# Patient Record
Sex: Female | Born: 1985 | Hispanic: No | Marital: Married | Smoking: Never smoker
Health system: Southern US, Community
[De-identification: ages and names within clinical notes are randomized; demographics above are authoritative.]

## PROBLEM LIST (undated history)

## (undated) DIAGNOSIS — K219 Gastro-esophageal reflux disease without esophagitis: Secondary | ICD-10-CM

## (undated) DIAGNOSIS — M419 Scoliosis, unspecified: Secondary | ICD-10-CM

## (undated) HISTORY — PX: NO PAST SURGERIES: SHX2092

---

## 2014-02-11 HISTORY — PX: WISDOM TOOTH EXTRACTION: SHX21

## 2015-07-24 LAB — OB RESULTS CONSOLE GC/CHLAMYDIA
Chlamydia: NEGATIVE
GC PROBE AMP, GENITAL: NEGATIVE

## 2015-07-24 LAB — OB RESULTS CONSOLE RUBELLA ANTIBODY, IGM: RUBELLA: IMMUNE

## 2015-07-24 LAB — OB RESULTS CONSOLE ANTIBODY SCREEN: Antibody Screen: NEGATIVE

## 2015-07-24 LAB — OB RESULTS CONSOLE ABO/RH: RH TYPE: POSITIVE

## 2015-07-24 LAB — OB RESULTS CONSOLE HIV ANTIBODY (ROUTINE TESTING): HIV: NONREACTIVE

## 2015-07-24 LAB — OB RESULTS CONSOLE RPR: RPR: NONREACTIVE

## 2015-07-24 LAB — OB RESULTS CONSOLE HEPATITIS B SURFACE ANTIGEN: Hepatitis B Surface Ag: NEGATIVE

## 2016-01-19 ENCOUNTER — Other Ambulatory Visit (HOSPITAL_COMMUNITY): Payer: Self-pay | Admitting: Obstetrics & Gynecology

## 2016-01-19 DIAGNOSIS — O283 Abnormal ultrasonic finding on antenatal screening of mother: Secondary | ICD-10-CM

## 2016-01-19 DIAGNOSIS — Z3689 Encounter for other specified antenatal screening: Secondary | ICD-10-CM

## 2016-01-19 DIAGNOSIS — Z3A34 34 weeks gestation of pregnancy: Secondary | ICD-10-CM

## 2016-01-25 ENCOUNTER — Encounter (HOSPITAL_COMMUNITY): Payer: Self-pay | Admitting: *Deleted

## 2016-01-26 ENCOUNTER — Ambulatory Visit (HOSPITAL_COMMUNITY)
Admission: RE | Admit: 2016-01-26 | Discharge: 2016-01-26 | Disposition: A | Discharge status: Home / Self Care | Payer: 59 | Source: Ambulatory Visit | Attending: Obstetrics & Gynecology | Admitting: Obstetrics & Gynecology

## 2016-01-26 ENCOUNTER — Encounter (HOSPITAL_COMMUNITY): Payer: Self-pay

## 2016-01-26 DIAGNOSIS — Z3A34 34 weeks gestation of pregnancy: Secondary | ICD-10-CM | POA: Insufficient documentation

## 2016-01-26 DIAGNOSIS — Z363 Encounter for antenatal screening for malformations: Secondary | ICD-10-CM | POA: Insufficient documentation

## 2016-01-26 DIAGNOSIS — O289 Unspecified abnormal findings on antenatal screening of mother: Secondary | ICD-10-CM | POA: Insufficient documentation

## 2016-01-26 DIAGNOSIS — O283 Abnormal ultrasonic finding on antenatal screening of mother: Secondary | ICD-10-CM

## 2016-01-26 DIAGNOSIS — Z3689 Encounter for other specified antenatal screening: Secondary | ICD-10-CM

## 2016-01-26 HISTORY — DX: Scoliosis, unspecified: M41.9

## 2016-02-02 LAB — OB RESULTS CONSOLE GBS: STREP GROUP B AG: NEGATIVE

## 2016-03-04 ENCOUNTER — Telehealth (HOSPITAL_COMMUNITY): Payer: Self-pay | Admitting: *Deleted

## 2016-03-04 ENCOUNTER — Encounter (HOSPITAL_COMMUNITY): Payer: Self-pay | Admitting: *Deleted

## 2016-03-04 NOTE — Telephone Encounter (Signed)
Preadmission screen  

## 2016-03-06 ENCOUNTER — Inpatient Hospital Stay (HOSPITAL_COMMUNITY)
Admission: AD | Admit: 2016-03-06 | Discharge: 2016-03-09 | DRG: 766 | Disposition: A | Discharge status: Home / Self Care | Payer: 59 | Source: Ambulatory Visit | Attending: Obstetrics & Gynecology | Admitting: Obstetrics & Gynecology

## 2016-03-06 ENCOUNTER — Inpatient Hospital Stay (HOSPITAL_COMMUNITY): Payer: 59 | Admitting: Anesthesiology

## 2016-03-06 ENCOUNTER — Encounter (HOSPITAL_COMMUNITY): Payer: Self-pay

## 2016-03-06 ENCOUNTER — Encounter (HOSPITAL_COMMUNITY)
Admission: AD | Disposition: A | Discharge status: Home / Self Care | Payer: Self-pay | Source: Ambulatory Visit | Attending: Obstetrics & Gynecology

## 2016-03-06 DIAGNOSIS — Z98891 History of uterine scar from previous surgery: Secondary | ICD-10-CM

## 2016-03-06 DIAGNOSIS — Z3A40 40 weeks gestation of pregnancy: Secondary | ICD-10-CM | POA: Diagnosis not present

## 2016-03-06 DIAGNOSIS — O324XX0 Maternal care for high head at term, not applicable or unspecified: Principal | ICD-10-CM | POA: Diagnosis present

## 2016-03-06 DIAGNOSIS — M419 Scoliosis, unspecified: Secondary | ICD-10-CM | POA: Diagnosis present

## 2016-03-06 DIAGNOSIS — Z3493 Encounter for supervision of normal pregnancy, unspecified, third trimester: Secondary | ICD-10-CM | POA: Diagnosis present

## 2016-03-06 DIAGNOSIS — Z8739 Personal history of other diseases of the musculoskeletal system and connective tissue: Secondary | ICD-10-CM

## 2016-03-06 LAB — CBC
HEMATOCRIT: 39.2 % (ref 36.0–46.0)
Hemoglobin: 13.3 g/dL (ref 12.0–15.0)
MCH: 29.9 pg (ref 26.0–34.0)
MCHC: 33.9 g/dL (ref 30.0–36.0)
MCV: 88.1 fL (ref 78.0–100.0)
Platelets: 198 10*3/uL (ref 150–400)
RBC: 4.45 MIL/uL (ref 3.87–5.11)
RDW: 14.4 % (ref 11.5–15.5)
WBC: 9.4 10*3/uL (ref 4.0–10.5)

## 2016-03-06 LAB — TYPE AND SCREEN
ABO/RH(D): AB POS
ANTIBODY SCREEN: NEGATIVE

## 2016-03-06 LAB — ABO/RH: ABO/RH(D): AB POS

## 2016-03-06 LAB — RPR: RPR: NONREACTIVE

## 2016-03-06 SURGERY — Surgical Case
Anesthesia: Epidural

## 2016-03-06 MED ORDER — LACTATED RINGERS IV SOLN
500.0000 mL | Freq: Once | INTRAVENOUS | Status: AC
  Administered 2016-03-06: 500 mL via INTRAVENOUS

## 2016-03-06 MED ORDER — SCOPOLAMINE 1 MG/3DAYS TD PT72
MEDICATED_PATCH | TRANSDERMAL | Status: DC | PRN
  Administered 2016-03-06: 1 via TRANSDERMAL

## 2016-03-06 MED ORDER — LACTATED RINGERS IV SOLN
500.0000 mL | INTRAVENOUS | Status: DC | PRN
  Administered 2016-03-06: 50 mL via INTRAVENOUS

## 2016-03-06 MED ORDER — CEFAZOLIN SODIUM-DEXTROSE 2-4 GM/100ML-% IV SOLN: 2.0000 g | Freq: Once | INTRAVENOUS | Status: DC

## 2016-03-06 MED ORDER — DIPHENHYDRAMINE HCL 25 MG PO CAPS: 25.0000 mg | ORAL_CAPSULE | Freq: Four times a day (QID) | ORAL | Status: DC | PRN

## 2016-03-06 MED ORDER — IBUPROFEN 600 MG PO TABS
600.0000 mg | ORAL_TABLET | Freq: Four times a day (QID) | ORAL | Status: DC
  Administered 2016-03-06 – 2016-03-09 (×11): 600 mg via ORAL
  Filled 2016-03-06 (×11): qty 1

## 2016-03-06 MED ORDER — LIDOCAINE HCL (PF) 1 % IJ SOLN
INTRAMUSCULAR | Status: DC | PRN
  Administered 2016-03-06: 4 mL via EPIDURAL

## 2016-03-06 MED ORDER — EPHEDRINE 5 MG/ML INJ: 10.0000 mg | INTRAVENOUS | Status: DC | PRN

## 2016-03-06 MED ORDER — SODIUM BICARBONATE 8.4 % IV SOLN
INTRAVENOUS | Status: DC | PRN
  Administered 2016-03-06 (×2): 5 mL via EPIDURAL

## 2016-03-06 MED ORDER — CEFAZOLIN SODIUM-DEXTROSE 2-4 GM/100ML-% IV SOLN
INTRAVENOUS | Status: AC
  Filled 2016-03-06: qty 100

## 2016-03-06 MED ORDER — SENNOSIDES-DOCUSATE SODIUM 8.6-50 MG PO TABS
2.0000 | ORAL_TABLET | ORAL | Status: DC
  Administered 2016-03-06 – 2016-03-08 (×3): 2 via ORAL
  Filled 2016-03-06 (×3): qty 2

## 2016-03-06 MED ORDER — PANTOPRAZOLE SODIUM 20 MG PO TBEC
20.0000 mg | DELAYED_RELEASE_TABLET | Freq: Every day | ORAL | Status: DC
  Administered 2016-03-06: 20 mg via ORAL
  Filled 2016-03-06 (×2): qty 1

## 2016-03-06 MED ORDER — NALBUPHINE HCL 10 MG/ML IJ SOLN: 5.0000 mg | INTRAMUSCULAR | Status: DC | PRN

## 2016-03-06 MED ORDER — METOCLOPRAMIDE HCL 5 MG/ML IJ SOLN
INTRAMUSCULAR | Status: DC | PRN
  Administered 2016-03-06: 10 mg via INTRAVENOUS

## 2016-03-06 MED ORDER — PHENYLEPHRINE HCL 10 MG/ML IJ SOLN
INTRAMUSCULAR | Status: DC | PRN
  Administered 2016-03-06 (×2): 40 ug via INTRAVENOUS

## 2016-03-06 MED ORDER — MEPERIDINE HCL 25 MG/ML IJ SOLN: 6.2500 mg | INTRAMUSCULAR | Status: DC | PRN

## 2016-03-06 MED ORDER — MORPHINE SULFATE (PF) 0.5 MG/ML IJ SOLN
INTRAMUSCULAR | Status: DC | PRN
  Administered 2016-03-06: 3 mg via EPIDURAL

## 2016-03-06 MED ORDER — MENTHOL 3 MG MT LOZG: 1.0000 | LOZENGE | OROMUCOSAL | Status: DC | PRN

## 2016-03-06 MED ORDER — OXYTOCIN 10 UNIT/ML IJ SOLN
INTRAMUSCULAR | Status: AC
  Filled 2016-03-06: qty 4

## 2016-03-06 MED ORDER — DIPHENHYDRAMINE HCL 50 MG/ML IJ SOLN: 12.5000 mg | INTRAMUSCULAR | Status: DC | PRN

## 2016-03-06 MED ORDER — DIPHENHYDRAMINE HCL 25 MG PO CAPS: 25.0000 mg | ORAL_CAPSULE | ORAL | Status: DC | PRN

## 2016-03-06 MED ORDER — COCONUT OIL OIL
1.0000 "application " | TOPICAL_OIL | Status: DC | PRN
  Administered 2016-03-09: 1 via TOPICAL
  Filled 2016-03-06: qty 120

## 2016-03-06 MED ORDER — SCOPOLAMINE 1 MG/3DAYS TD PT72
1.0000 | MEDICATED_PATCH | Freq: Once | TRANSDERMAL | Status: DC
  Filled 2016-03-06: qty 1

## 2016-03-06 MED ORDER — OXYTOCIN 40 UNITS IN LACTATED RINGERS INFUSION - SIMPLE MED
1.0000 m[IU]/min | INTRAVENOUS | Status: DC
  Administered 2016-03-06 (×2): 2 m[IU]/min via INTRAVENOUS

## 2016-03-06 MED ORDER — NALBUPHINE HCL 10 MG/ML IJ SOLN: 5.0000 mg | Freq: Once | INTRAMUSCULAR | Status: DC | PRN

## 2016-03-06 MED ORDER — FENTANYL 2.5 MCG/ML BUPIVACAINE 1/10 % EPIDURAL INFUSION (WH - ANES)
14.0000 mL/h | INTRAMUSCULAR | Status: DC | PRN
  Administered 2016-03-06 (×2): 14 mL/h via EPIDURAL
  Administered 2016-03-06: 10 mL/h via EPIDURAL
  Filled 2016-03-06: qty 100

## 2016-03-06 MED ORDER — LIDOCAINE-EPINEPHRINE (PF) 2 %-1:200000 IJ SOLN
INTRAMUSCULAR | Status: AC
  Filled 2016-03-06: qty 20

## 2016-03-06 MED ORDER — PHENYLEPHRINE 40 MCG/ML (10ML) SYRINGE FOR IV PUSH (FOR BLOOD PRESSURE SUPPORT): 80.0000 ug | PREFILLED_SYRINGE | INTRAVENOUS | Status: DC | PRN

## 2016-03-06 MED ORDER — MORPHINE SULFATE (PF) 0.5 MG/ML IJ SOLN
INTRAMUSCULAR | Status: AC
  Filled 2016-03-06: qty 10

## 2016-03-06 MED ORDER — LACTATED RINGERS IV SOLN
INTRAVENOUS | Status: DC
Start: 2016-03-06 — End: 2016-03-06
  Administered 2016-03-06: 150 mL/h via INTRAUTERINE

## 2016-03-06 MED ORDER — NALOXONE HCL 0.4 MG/ML IJ SOLN: 0.4000 mg | INTRAMUSCULAR | Status: DC | PRN

## 2016-03-06 MED ORDER — PHENYLEPHRINE 40 MCG/ML (10ML) SYRINGE FOR IV PUSH (FOR BLOOD PRESSURE SUPPORT)
PREFILLED_SYRINGE | INTRAVENOUS | Status: AC
  Filled 2016-03-06: qty 20

## 2016-03-06 MED ORDER — ACETAMINOPHEN 325 MG PO TABS: 650.0000 mg | ORAL_TABLET | ORAL | Status: DC | PRN

## 2016-03-06 MED ORDER — IBUPROFEN 600 MG PO TABS: 600.0000 mg | ORAL_TABLET | Freq: Four times a day (QID) | ORAL | Status: DC

## 2016-03-06 MED ORDER — TERBUTALINE SULFATE 1 MG/ML IJ SOLN: 0.2500 mg | Freq: Once | INTRAMUSCULAR | Status: DC | PRN

## 2016-03-06 MED ORDER — SODIUM CHLORIDE 0.9% FLUSH: 3.0000 mL | INTRAVENOUS | Status: DC | PRN

## 2016-03-06 MED ORDER — LACTATED RINGERS IV SOLN
INTRAVENOUS | Status: DC
  Administered 2016-03-07: 05:00:00 via INTRAVENOUS
  Administered 2016-03-07: 1 mL via INTRAVENOUS

## 2016-03-06 MED ORDER — FLEET ENEMA 7-19 GM/118ML RE ENEM: 1.0000 | ENEMA | RECTAL | Status: DC | PRN

## 2016-03-06 MED ORDER — LACTATED RINGERS IV SOLN
INTRAVENOUS | Status: DC
  Administered 2016-03-06 (×2): via INTRAVENOUS
  Administered 2016-03-06: 125 mL/h via INTRAVENOUS

## 2016-03-06 MED ORDER — CEFAZOLIN SODIUM-DEXTROSE 2-3 GM-% IV SOLR
INTRAVENOUS | Status: DC | PRN
  Administered 2016-03-06: 2 g via INTRAVENOUS

## 2016-03-06 MED ORDER — WITCH HAZEL-GLYCERIN EX PADS: 1.0000 "application " | MEDICATED_PAD | CUTANEOUS | Status: DC | PRN

## 2016-03-06 MED ORDER — OXYTOCIN 40 UNITS IN LACTATED RINGERS INFUSION - SIMPLE MED: 2.5000 [IU]/h | INTRAVENOUS | Status: AC

## 2016-03-06 MED ORDER — SODIUM CHLORIDE 0.9 % IR SOLN
Status: DC | PRN
  Administered 2016-03-06: 1

## 2016-03-06 MED ORDER — FENTANYL 2.5 MCG/ML BUPIVACAINE 1/10 % EPIDURAL INFUSION (WH - ANES)
INTRAMUSCULAR | Status: AC
  Filled 2016-03-06: qty 100

## 2016-03-06 MED ORDER — ONDANSETRON HCL 4 MG/2ML IJ SOLN: 4.0000 mg | Freq: Three times a day (TID) | INTRAMUSCULAR | Status: DC | PRN

## 2016-03-06 MED ORDER — TETANUS-DIPHTH-ACELL PERTUSSIS 5-2.5-18.5 LF-MCG/0.5 IM SUSP: 0.5000 mL | Freq: Once | INTRAMUSCULAR | Status: DC

## 2016-03-06 MED ORDER — ONDANSETRON HCL 4 MG/2ML IJ SOLN
INTRAMUSCULAR | Status: AC
  Filled 2016-03-06: qty 2

## 2016-03-06 MED ORDER — OXYTOCIN 40 UNITS IN LACTATED RINGERS INFUSION - SIMPLE MED
2.5000 [IU]/h | INTRAVENOUS | Status: DC
  Filled 2016-03-06: qty 1000

## 2016-03-06 MED ORDER — LACTATED RINGERS IV SOLN
INTRAVENOUS | Status: DC | PRN
  Administered 2016-03-06 (×3): via INTRAVENOUS

## 2016-03-06 MED ORDER — KETOROLAC TROMETHAMINE 30 MG/ML IJ SOLN: 30.0000 mg | Freq: Four times a day (QID) | INTRAMUSCULAR | Status: AC | PRN

## 2016-03-06 MED ORDER — LACTATED RINGERS IV SOLN: 500.0000 mL | Freq: Once | INTRAVENOUS | Status: DC

## 2016-03-06 MED ORDER — LIDOCAINE HCL (PF) 1 % IJ SOLN: 30.0000 mL | INTRAMUSCULAR | Status: DC | PRN

## 2016-03-06 MED ORDER — DIBUCAINE 1 % RE OINT: 1.0000 "application " | TOPICAL_OINTMENT | RECTAL | Status: DC | PRN

## 2016-03-06 MED ORDER — SIMETHICONE 80 MG PO CHEW
80.0000 mg | CHEWABLE_TABLET | ORAL | Status: DC
  Administered 2016-03-06 – 2016-03-08 (×3): 80 mg via ORAL
  Filled 2016-03-06 (×3): qty 1

## 2016-03-06 MED ORDER — OXYTOCIN BOLUS FROM INFUSION: 500.0000 mL | Freq: Once | INTRAVENOUS | Status: DC

## 2016-03-06 MED ORDER — DEXAMETHASONE SODIUM PHOSPHATE 10 MG/ML IJ SOLN
INTRAMUSCULAR | Status: DC | PRN
  Administered 2016-03-06: 4 mg via INTRAVENOUS

## 2016-03-06 MED ORDER — OXYTOCIN 10 UNIT/ML IJ SOLN
INTRAVENOUS | Status: DC | PRN
  Administered 2016-03-06: 40 [IU] via INTRAVENOUS

## 2016-03-06 MED ORDER — METHYLERGONOVINE MALEATE 0.2 MG/ML IJ SOLN
INTRAMUSCULAR | Status: AC
  Filled 2016-03-06: qty 1

## 2016-03-06 MED ORDER — SIMETHICONE 80 MG PO CHEW
80.0000 mg | CHEWABLE_TABLET | Freq: Three times a day (TID) | ORAL | Status: DC
  Administered 2016-03-07 – 2016-03-09 (×7): 80 mg via ORAL
  Filled 2016-03-06 (×7): qty 1

## 2016-03-06 MED ORDER — LACTATED RINGERS IV SOLN
INTRAVENOUS | Status: DC | PRN
  Administered 2016-03-06: 19:00:00 via INTRAVENOUS

## 2016-03-06 MED ORDER — SOD CITRATE-CITRIC ACID 500-334 MG/5ML PO SOLN
30.0000 mL | ORAL | Status: DC | PRN
  Administered 2016-03-06 (×2): 30 mL via ORAL
  Filled 2016-03-06 (×2): qty 15

## 2016-03-06 MED ORDER — ZOLPIDEM TARTRATE 5 MG PO TABS: 5.0000 mg | ORAL_TABLET | Freq: Every evening | ORAL | Status: DC | PRN

## 2016-03-06 MED ORDER — METHYLERGONOVINE MALEATE 0.2 MG/ML IJ SOLN
INTRAMUSCULAR | Status: DC | PRN
  Administered 2016-03-06: 0.2 mg via INTRAMUSCULAR

## 2016-03-06 MED ORDER — NALOXONE HCL 2 MG/2ML IJ SOSY
1.0000 ug/kg/h | PREFILLED_SYRINGE | INTRAVENOUS | Status: DC | PRN
  Filled 2016-03-06: qty 2

## 2016-03-06 MED ORDER — FENTANYL CITRATE (PF) 100 MCG/2ML IJ SOLN
INTRAMUSCULAR | Status: AC
  Filled 2016-03-06: qty 2

## 2016-03-06 MED ORDER — SODIUM BICARBONATE 8.4 % IV SOLN
INTRAVENOUS | Status: AC
  Filled 2016-03-06: qty 50

## 2016-03-06 MED ORDER — PROMETHAZINE HCL 25 MG/ML IJ SOLN
6.2500 mg | INTRAMUSCULAR | Status: DC | PRN
Start: 2016-03-06 — End: 2016-03-06

## 2016-03-06 MED ORDER — LACTATED RINGERS IV SOLN: INTRAVENOUS | Status: DC

## 2016-03-06 MED ORDER — ONDANSETRON HCL 4 MG/2ML IJ SOLN
4.0000 mg | Freq: Four times a day (QID) | INTRAMUSCULAR | Status: DC | PRN
  Administered 2016-03-06: 4 mg via INTRAVENOUS
  Filled 2016-03-06: qty 2

## 2016-03-06 MED ORDER — SIMETHICONE 80 MG PO CHEW: 80.0000 mg | CHEWABLE_TABLET | ORAL | Status: DC | PRN

## 2016-03-06 MED ORDER — FENTANYL CITRATE (PF) 100 MCG/2ML IJ SOLN
25.0000 ug | INTRAMUSCULAR | Status: DC | PRN
  Administered 2016-03-06: 50 ug via INTRAVENOUS

## 2016-03-06 MED ORDER — PRENATAL MULTIVITAMIN CH
1.0000 | ORAL_TABLET | Freq: Every day | ORAL | Status: DC
  Administered 2016-03-07 – 2016-03-09 (×3): 1 via ORAL
  Filled 2016-03-06 (×3): qty 1

## 2016-03-06 MED ORDER — OXYCODONE-ACETAMINOPHEN 5-325 MG PO TABS: 1.0000 | ORAL_TABLET | ORAL | Status: DC | PRN

## 2016-03-06 MED ORDER — OXYCODONE-ACETAMINOPHEN 5-325 MG PO TABS: 2.0000 | ORAL_TABLET | ORAL | Status: DC | PRN

## 2016-03-06 SURGICAL SUPPLY — 41 items
BENZOIN TINCTURE PRP APPL 2/3 (GAUZE/BANDAGES/DRESSINGS) ×3 IMPLANT
CHLORAPREP W/TINT 26ML (MISCELLANEOUS) ×3 IMPLANT
CLAMP CORD UMBIL (MISCELLANEOUS) IMPLANT
CLOSURE WOUND 1/2 X4 (GAUZE/BANDAGES/DRESSINGS) ×1
CLOTH BEACON ORANGE TIMEOUT ST (SAFETY) ×3 IMPLANT
CONTAINER PREFILL 10% NBF 15ML (MISCELLANEOUS) IMPLANT
DRAIN JACKSON PRT FLT 10 (DRAIN) IMPLANT
DRSG OPSITE POSTOP 4X10 (GAUZE/BANDAGES/DRESSINGS) ×3 IMPLANT
ELECT REM PT RETURN 9FT ADLT (ELECTROSURGICAL) ×3
ELECTRODE REM PT RTRN 9FT ADLT (ELECTROSURGICAL) ×1 IMPLANT
EVACUATOR SILICONE 100CC (DRAIN) IMPLANT
EXTRACTOR VACUUM M CUP 4 TUBE (SUCTIONS) IMPLANT
EXTRACTOR VACUUM M CUP 4' TUBE (SUCTIONS)
GLOVE BIO SURGEON STRL SZ 6.5 (GLOVE) ×2 IMPLANT
GLOVE BIO SURGEONS STRL SZ 6.5 (GLOVE) ×1
GLOVE BIOGEL PI IND STRL 7.0 (GLOVE) ×2 IMPLANT
GLOVE BIOGEL PI INDICATOR 7.0 (GLOVE) ×4
GOWN STRL REUS W/TWL LRG LVL3 (GOWN DISPOSABLE) ×6 IMPLANT
HEMOSTAT SURGICEL 2X14 (HEMOSTASIS) ×3 IMPLANT
KIT ABG SYR 3ML LUER SLIP (SYRINGE) IMPLANT
NEEDLE HYPO 25X5/8 SAFETYGLIDE (NEEDLE) IMPLANT
NS IRRIG 1000ML POUR BTL (IV SOLUTION) ×3 IMPLANT
PACK C SECTION WH (CUSTOM PROCEDURE TRAY) ×3 IMPLANT
PAD OB MATERNITY 4.3X12.25 (PERSONAL CARE ITEMS) ×3 IMPLANT
PENCIL SMOKE EVAC W/HOLSTER (ELECTROSURGICAL) ×3 IMPLANT
RTRCTR C-SECT PINK 25CM LRG (MISCELLANEOUS) ×3 IMPLANT
SPONGE LAP 18X18 X RAY DECT (DISPOSABLE) ×6 IMPLANT
STRIP CLOSURE SKIN 1/2X4 (GAUZE/BANDAGES/DRESSINGS) ×2 IMPLANT
SUT CHROMIC 0 CT 1 (SUTURE) ×3 IMPLANT
SUT MNCRL AB 3-0 PS2 27 (SUTURE) ×3 IMPLANT
SUT PLAIN 2 0 (SUTURE) ×4
SUT PLAIN 2 0 XLH (SUTURE) ×3 IMPLANT
SUT PLAIN ABS 2-0 CT1 27XMFL (SUTURE) ×2 IMPLANT
SUT SILK 2 0 SH (SUTURE) IMPLANT
SUT VIC AB 0 CTX 36 (SUTURE) ×10
SUT VIC AB 0 CTX36XBRD ANBCTRL (SUTURE) ×5 IMPLANT
SUT VIC AB 2-0 SH 27 (SUTURE) ×2
SUT VIC AB 2-0 SH 27XBRD (SUTURE) ×1 IMPLANT
SUT VIC AB 4-0 KS 27 (SUTURE) ×3 IMPLANT
TOWEL OR 17X24 6PK STRL BLUE (TOWEL DISPOSABLE) ×3 IMPLANT
TRAY FOLEY CATH SILVER 14FR (SET/KITS/TRAYS/PACK) ×3 IMPLANT

## 2016-03-06 NOTE — Anesthesia Preprocedure Evaluation (Signed)
Anesthesia Evaluation  Patient identified by MRN, date of birth, ID band Patient awake    Reviewed: Allergy & Precautions, NPO status , Patient's Chart, lab work & pertinent test results  Airway Mallampati: I  TM Distance: >3 FB Neck ROM: Full    Dental  (+) Teeth Intact, Dental Advisory Given   Pulmonary neg pulmonary ROS,    breath sounds clear to auscultation       Cardiovascular negative cardio ROS   Rhythm:Regular Rate:Normal     Neuro/Psych negative neurological ROS  negative psych ROS   GI/Hepatic negative GI ROS, Neg liver ROS,   Endo/Other  negative endocrine ROS  Renal/GU negative Renal ROS  negative genitourinary   Musculoskeletal negative musculoskeletal ROS (+)   Abdominal   Peds negative pediatric ROS (+)  Hematology negative hematology ROS (+)   Anesthesia Other Findings   Reproductive/Obstetrics (+) Pregnancy                             Lab Results  Component Value Date   WBC 9.4 03/06/2016   HGB 13.3 03/06/2016   HCT 39.2 03/06/2016   MCV 88.1 03/06/2016   PLT 198 03/06/2016   No results found for: INR, PROTIME   Anesthesia Physical Anesthesia Plan  ASA: II  Anesthesia Plan: Epidural   Post-op Pain Management:    Induction:   Airway Management Planned:   Additional Equipment:   Intra-op Plan:   Post-operative Plan:   Informed Consent: I have reviewed the patients History and Physical, chart, labs and discussed the procedure including the risks, benefits and alternatives for the proposed anesthesia with the patient or authorized representative who has indicated his/her understanding and acceptance.     Plan Discussed with:   Anesthesia Plan Comments:         Anesthesia Quick Evaluation

## 2016-03-06 NOTE — Transfer of Care (Signed)
Immediate Anesthesia Transfer of Care Note  Patient: Jessica Wood  Procedure(s) Performed: Procedure(s): CESAREAN SECTION (N/A)  Patient Location: PACU  Anesthesia Type:Epidural  Level of Consciousness: awake, alert  and oriented  Airway & Oxygen Therapy: Patient Spontanous Breathing  Post-op Assessment: Report given to RN  Post vital signs: Reviewed and stable  Last Vitals:  Vitals:   03/06/16 1731 03/06/16 1736  BP: 119/66   Pulse: 85   Resp: 20 20  Temp:      Last Pain:  Vitals:   03/06/16 1723  TempSrc:   PainSc: 6          Complications: No apparent anesthesia complications

## 2016-03-06 NOTE — Progress Notes (Signed)
[facility] came out with pt repositioning

## 2016-03-06 NOTE — Progress Notes (Signed)
Patient ID: Jessica Wood, female   DOB: [birth year], 31 y.o.   MRN: [id number]  Pt comfortable with epidural BP 122/63   Pulse (!) 103   Temp 98.9 F (37.2 C) (Oral)   Resp 20   Ht 4' 11.5" (1.511 m)   Wt 137 lb (62.1 kg)   LMP 05/27/2015   SpO2 99%   BMI 27.21 kg/m   FHTS 140 with good variablity.  She had a decel to the 60s  For four minutes Cx complete and zero station FHR improved after amnioinfusion and oxygen Continue pushing.  Anticipate SVD

## 2016-03-06 NOTE — MAU Note (Signed)
Pt presents with complaint of contractions.  

## 2016-03-06 NOTE — Anesthesia Procedure Notes (Deleted)
Spinal

## 2016-03-06 NOTE — H&P (Signed)
Jessica Wood is a 31 y.o. female, G1P0 at 40.4 weeks, presenting for labor. Contractions since 2300 on 1/23.  Denies leaking fluid, denies bloody show.  Fm+.  Prenatal hx unremarkable  Patient Active Problem List   Diagnosis Date Noted  . Normal labor and delivery 03/06/2016    History of present pregnancy: Patient entered care at 9.6 weeks.   EDC of 03/02/16 was established by LMP.   Anatomy scan:  19.4 weeks, with normal findings and an posterior low lying placenta.   Additional [place] evaluations:  33.6 Loy lying placenta resolved.  [facility] 01/26/16  Consult MFM. US wnl.  AGA normal Last week BPP 8/10  Last evaluation: 03/05/2016  OB History    Gravida Para Term Preterm AB Living   1             SAB TAB Ectopic Multiple Live Births                 Past Medical History:  Diagnosis Date  . Scoliosis    Past Surgical History:  Procedure Laterality Date  . NO PAST SURGERIES     Family History: family history is not on file. Social History:  reports that she has never smoked. She has never used smokeless tobacco. She reports that she does not drink alcohol or use drugs.   Prenatal Transfer Tool  Maternal Diabetes: No Genetic Screening: Not done Maternal Ultrasounds/Referrals: Normal Fetal Ultrasounds or other Referrals:  None Maternal Substance Abuse:  No Significant Maternal Medications:  None Significant Maternal Lab Results: None  TDAP Yes Flu Yes ROS:  All 10 systems reviewed and negative expect as stated above  No Known Allergies   Dilation: 6 Effacement (%): 100 Station: -2 Exam by:: CMaryruth Hancock RNC Blood pressure 117/62, pulse 92, temperature 97.5 F (36.4 C), temperature source Oral, resp. rate 17, height 4' 11.5" (1.511 m), weight 62.1 kg (137 lb), last menstrual period 05/27/2015, SpO2 100 %.  Chest clear Heart RRR without murmur Abd gravid, NT, FH appropriate Pelvic: adequate Ext: +2/+2, negative edema  FHR: Category 1 UCs:  3 in 10  minutes  Prenatal labs: ABO, Rh: AB/Positive/-- (06/12 0000) Antibody: Negative (06/12 0000) Rubella:  Immune RPR: Nonreactive (06/12 0000)  HBsAg: Negative (06/12 0000)  HIV: Non-reactive (06/12 0000)  GBS: Negative (12/22 0000) Sickle cell/Hgb electrophoresis:  Negative Pap:  Negative 2017 GC:  Neg Chlamydia:  Neg Genetic screenings:  Not done Glucola:  Neg Other:   Hgb 12.8 at NOB, 11.2 at 28 weeks       Assessment/Plan: IUP at 40.4 active labor Cat 1 strip  Plan: Admit to [facility] per consult with Alesia Richards Routine [facility] orders Pain med/epidural prn   Pleas Koch ProtheroCNM, MSN 03/06/2016, 6:15 AM

## 2016-03-06 NOTE — Anesthesia Postprocedure Evaluation (Signed)
Anesthesia Post Note  Patient: Jessica Wood  Procedure(s) Performed: Procedure(s) (LRB): CESAREAN SECTION (N/A)  Patient location during evaluation: PACU Anesthesia Type: Epidural Level of consciousness: awake Pain management: pain level controlled Vital Signs Assessment: post-procedure vital signs reviewed and stable Respiratory status: spontaneous breathing Cardiovascular status: stable Postop Assessment: no headache, no backache, epidural receding and patient able to bend at knees Anesthetic complications: no        Last Vitals:  Vitals:   03/06/16 1945 03/06/16 2000  BP: 104/70 120/72  Pulse: 82 73  Resp: 15 18  Temp: 36.9 C     Last Pain:  Vitals:   03/06/16 2000  TempSrc:   PainSc: 0-No pain   Pain Goal:                 Jessica Wood,Jessica Wood

## 2016-03-06 NOTE — Progress Notes (Signed)
Pt reminded that Dr Charlesetta Garibaldi will be returning about 1800 and would probably recommend a cesarean section

## 2016-03-06 NOTE — Anesthesia Procedure Notes (Signed)
Epidural Patient location during procedure: OB Start time: 03/06/2016 7:20 AM End time: 03/06/2016 7:27 AM  Staffing Anesthesiologist: Suella Broad D Performed: anesthesiologist   Preanesthetic Checklist Completed: patient identified, site marked, surgical consent, pre-op evaluation, timeout performed, IV checked, risks and benefits discussed and monitors and equipment checked  Epidural Patient position: sitting Prep: ChloraPrep Patient monitoring: heart rate, continuous pulse ox and blood pressure Approach: midline Location: L3-L4 Injection technique: LOR saline  Needle:  Needle type: Tuohy  Needle gauge: 17 G Needle length: 9 cm Catheter type: closed end flexible Catheter size: 20 Guage Test dose: negative and 1.5% lidocaine  Assessment Events: blood not aspirated, injection not painful, no injection resistance and no paresthesia  Additional Notes LOR @ 4.5  Patient identified. Risks/Benefits/Options discussed with patient including but not limited to bleeding, infection, nerve damage, paralysis, failed block, incomplete pain control, headache, blood pressure changes, nausea, vomiting, reactions to medications, itching and postpartum back pain. Confirmed with bedside nurse the patient's most recent platelet count. Confirmed with patient that they are not currently taking any anticoagulation, have any bleeding history or any family history of bleeding disorders. Patient expressed understanding and wished to proceed. All questions were answered. Sterile technique was used throughout the entire procedure. Please see nursing notes for vital signs. Test dose was given through epidural catheter and negative prior to continuing to dose epidural or start infusion. Warning signs of high block given to the patient including shortness of breath, tingling/numbness in hands, complete motor block, or any concerning symptoms with instructions to call for help. Patient was given instructions on  fall risk and not to get out of bed. All questions and concerns addressed with instructions to call with any issues or inadequate analgesia.    Reason for block:procedure for pain

## 2016-03-06 NOTE — Progress Notes (Signed)
Epidural bag empty.  Pt requesting that lNurse not add new bag at this time.  Dr Smith Robert notified epidural is empty and pt request that replacement bag not be added at this time.  Pt would like to "feel" more so she can push more effectively

## 2016-03-06 NOTE — Progress Notes (Signed)
Notified of pt arrival in MAU and exam. Ok to admit to labor and delivery

## 2016-03-06 NOTE — Progress Notes (Signed)
Pt sitting up in high fowlers to eat (clear Liquied)

## 2016-03-07 ENCOUNTER — Encounter (HOSPITAL_COMMUNITY): Payer: Self-pay | Admitting: Obstetrics and Gynecology

## 2016-03-07 LAB — CBC
HCT: 28.8 % — ABNORMAL LOW (ref 36.0–46.0)
Hemoglobin: 10 g/dL — ABNORMAL LOW (ref 12.0–15.0)
MCH: 30.8 pg (ref 26.0–34.0)
MCHC: 34.7 g/dL (ref 30.0–36.0)
MCV: 88.6 fL (ref 78.0–100.0)
Platelets: 172 10*3/uL (ref 150–400)
RBC: 3.25 MIL/uL — ABNORMAL LOW (ref 3.87–5.11)
RDW: 14.6 % (ref 11.5–15.5)
WBC: 11.6 10*3/uL — ABNORMAL HIGH (ref 4.0–10.5)

## 2016-03-07 NOTE — Progress Notes (Signed)
   03/07/16 1819  Activity and Safety  Activity Ambulate in hall  Activity Assistance Stand by assist  Safety Bed in lower position;Call bell within reach  pt states not dizzy anymore with ambulation after nap

## 2016-03-07 NOTE — Progress Notes (Addendum)
Jessica Wood [id number] Postpartum Day 1 S/P Primary Cesarean Section due to Failure to Descend  Subjective: Patient up to side of bed, denies syncope, but reports some dizziness with standing. Has not had a regular diet, but denies N/V. Patient reports no bowel movement, but is passing flatus.  Foley catheter remains in place and reports bleeding is "okay, its a little bit."  Patient is breastfeeding and reports going well.  Desires unsure of postpartum contraception.  Pain is being appropriately managed with use of motrin.   Objective: Temp:  [97.6 F (36.4 C)-99.9 F (37.7 C)] 98.9 F (37.2 C) (01/25 0445) Pulse Rate:  [64-105] 68 (01/25 0030) Resp:  [14-20] 18 (01/25 0445) BP: (79-131)/(44-81) 115/54 (01/25 0030) SpO2:  [91 %-100 %] 95 % (01/25 0445)   Recent Labs  03/06/16 0611  HGB 13.3  HCT 39.2  WBC 9.4    Physical Exam:  General: alert, cooperative and no distress Mood/Affect: Appropriate/Appropriate Lungs: clear to auscultation, no wheezes, rales or rhonchi, symmetric air entry.  Heart: normal rate and regular rhythm. Breast: Soft, NT, Filling. Abdomen:  + bowel sounds, Appropriately Tender, Mild Distention Incision: no significant drainage, Honeycomb dressing  Uterine Fundus: firm, U/-2 Lochia: appropriate Skin: Warm, Dry. DVT Evaluation: No cords or calf tenderness. No significant calf/ankle edema. SCD on and functioning JP drain:   None  Assessment Post Operative Day 1 S/P Primary C/S Normal Involution BreastFeeding Dizziness Female child  Plan: -Educated on ambulation, pain mgmt, and return to regular diet -Instructed to inform nurse of continued dizziness after regular meal -HgB pending -Inpatient circumcision -Continue other mgmt as ordered -Dr. Gillermo Murdoch to be updated on patient status  [name] MSN, CNM 03/07/2016, 6:47 AM   Hgb 10.0, baby boy circumcised, foley still in place because pt was a little dizzy earlier.  Will d/c foley and cont  Is/Os discussed with RN- AYR

## 2016-03-07 NOTE — Anesthesia Postprocedure Evaluation (Addendum)
Anesthesia Post Note  Patient: Jessica Wood  Procedure(s) Performed: Procedure(s) (LRB): CESAREAN SECTION (N/A)  Patient location during evaluation: Mother Baby Anesthesia Type: Epidural Level of consciousness: awake Pain management: satisfactory to patient Vital Signs Assessment: post-procedure vital signs reviewed and stable Respiratory status: spontaneous breathing Cardiovascular status: stable Anesthetic complications: no        Last Vitals:  Vitals:   03/07/16 0030 03/07/16 0445  BP: (!) 115/54   Pulse: 68   Resp: 18 18  Temp: 36.9 C 37.2 C    Last Pain:  Vitals:   03/07/16 0512  TempSrc:   PainSc: 2    Pain Goal:                 [name]

## 2016-03-07 NOTE — Progress Notes (Signed)
   03/07/16 1500  Vital Signs  BP (!) 109/57  BP Location Left Arm  Patient Position (if appropriate) Semi-fowlers  Pulse Rate 71  Resp 18  Temp 98.1 F (36.7 C)  Pt c/o dizziness. Encouraged incentive spirometer & to call out before ambulating

## 2016-03-07 NOTE — Addendum Note (Signed)
Addendum  created 03/07/16 0813 by Asher Muir, CRNA   Sign clinical note

## 2016-03-07 NOTE — Lactation Note (Signed)
This note was copied from a baby's chart. Lactation Consultation Note: initial visit with mom. Baby now 76 hours old. She reports the baby has had a couple of good feedings since birth. Last fed about 1 hour ago and is asleep on mom's chest at present. Reviewed feeding cues and encouraged to feed whenever she sees them. Has DEBP in room. States the nurse went over it but it was too fast and they don't remember it. Reviewed setup, use and cleaning of pump pieces. States she wants to try pumping. Plans to get Medela pump from her insurance company- to pick it up at [employer].  BF brochure given Reviewed our phone number, OP appointments and BFSG as resources for support after DC. No questions at present. To call for assist prn  Patient Name: Jessica Wood [id number] Date: 03/07/2016 Reason for consult: Initial assessment   Maternal Data Formula Feeding for Exclusion: No Has patient been taught Hand Expression?: Yes Does the patient have breastfeeding experience prior to this delivery?: No  Feeding Feeding Type: Breast Fed Length of feed: 15 min  LATCH Score/Interventions                      Lactation Tools Discussed/Used Pump Review: Setup, frequency, and cleaning Initiated by:: RN Date initiated:: 03/07/16   Consult Status Consult Status: Follow-up Date: 03/08/16 Follow-up type: In-patient    Truddie Crumble 03/07/2016, 12:07 PM

## 2016-03-08 DIAGNOSIS — Z98891 History of uterine scar from previous surgery: Secondary | ICD-10-CM

## 2016-03-08 DIAGNOSIS — Z8739 Personal history of other diseases of the musculoskeletal system and connective tissue: Secondary | ICD-10-CM

## 2016-03-08 MED ORDER — OXYCODONE HCL 5 MG PO TABS
5.0000 mg | ORAL_TABLET | ORAL | Status: DC | PRN
  Administered 2016-03-08 (×2): 5 mg via ORAL
  Filled 2016-03-08 (×2): qty 1

## 2016-03-08 MED ORDER — OXYCODONE HCL 5 MG PO TABS
10.0000 mg | ORAL_TABLET | ORAL | Status: DC | PRN
  Administered 2016-03-08: 10 mg via ORAL
  Filled 2016-03-08: qty 2

## 2016-03-08 MED ORDER — ACETAMINOPHEN 325 MG PO TABS
650.0000 mg | ORAL_TABLET | Freq: Four times a day (QID) | ORAL | Status: DC
  Administered 2016-03-08 – 2016-03-09 (×6): 650 mg via ORAL
  Filled 2016-03-08 (×6): qty 2

## 2016-03-08 NOTE — Progress Notes (Signed)
Subjective: Postpartum Day 2: Cesarean Delivery Patient reports incisional pain, tolerating PO and + flatus.  Ambulating well.  Breastfeeding going well.  Pt desires to stay until tomorrow Objective: Vital signs in last 24 hours: Temp:  [98.1 F (36.7 C)-98.4 F (36.9 C)] 98.2 F (36.8 C) (01/26 0543) Pulse Rate:  [61-88] 61 (01/26 0543) Resp:  [18] 18 (01/26 0543) BP: (92-109)/(40-58) 101/58 (01/26 0543) SpO2:  [99 %-100 %] 99 % (01/25 1819)  Physical Exam:  General: alert, cooperative and no distress Lochia: appropriate Uterine Fundus: firm Incision: incision covered with honeycomb and dry DVT Evaluation: No evidence of DVT seen on physical exam. Negative Homan's sign.   Recent Labs  03/06/16 0611 03/07/16 0510  HGB 13.3 10.0*  HCT 39.2 28.8*    Assessment/Plan: Status post Cesarean section. Doing well postoperatively.  Continue current care.  Plan on discharge tomorrow Will rotate Motrin with Tylenol and add oxycodone instead of percocet.  Pt having breakthrough pain but does not want to take narcotics unless necessary.  Pleas Koch Jaze Rodino 03/08/2016, 8:55 AM

## 2016-03-08 NOTE — Lactation Note (Signed)
This note was copied from a baby's chart. Lactation Consultation Note  Patient Name: Jessica Wood [id number] Date: 03/08/2016 Reason for consult: Follow-up assessment  Visited with Mom, baby 31 hrs old.  Baby crying, and Mom has a room full of visitors.  Offered to assist with feeding as she stated that latches hurt at times.  Mom declined the assistance as she said she had just fed baby an hour ago.  Discussed cluster feeding and importance of feeding baby when he shows cues that he is hungry.  Discussed importance of placing baby STS to facilitate a longer more effective feeding.  Mom positioned baby in cradle hold, and she was pushing her nipple into baby's mouth.  Asked if LC could assist with an easier way, and Mom agreed.  Helped with a football hold, and teaching done throughout to help Mom understand importance of good support and plenty of pillows.  Mom needing guidance on hand placement and how to sandwich breast.  Mom has small breasts, and erect nipples.  Baby able to latch deeply and multiple swallows identified.  Showed Mom how to use alternate breast compression to increase intake at the breast.  Mom denies feeling any discomfort.  Mom to call for assistance as needed. Lactation to follow-up in am prior to scheduled discharge.     Consult Status Consult Status: Follow-up Date: 03/09/16 Follow-up type: In-patient    [name] 03/08/2016, 6:48 PM

## 2016-03-08 NOTE — Addendum Note (Signed)
Addendum  created 03/08/16 1902 by Effie Berkshire, MD   Anesthesia Staff edited

## 2016-03-09 ENCOUNTER — Inpatient Hospital Stay (HOSPITAL_COMMUNITY): Admission: RE | Admit: 2016-03-09 | Payer: 59 | Source: Ambulatory Visit

## 2016-03-09 MED ORDER — OXYCODONE HCL 5 MG PO TABS
5.0000 mg | ORAL_TABLET | ORAL | 0 refills | Status: DC | PRN
Start: 2016-03-09 — End: 2018-10-21

## 2016-03-09 NOTE — Discharge Summary (Signed)
OB Discharge Summary     Patient Name: Jessica Wood DOB: [birth year] MRN: [id number]  Date of admission: 03/06/2016 Delivering MD: Crawford Givens   Date of discharge: 03/09/2016  Admitting diagnosis: 40 wks ctx Intrauterine pregnancy: [redacted]w[redacted]d     Secondary diagnosis:  Principal Problem:   S/P primary low transverse C-section Active Problems:   H/O scoliosis  Additional problems: None     Discharge diagnosis: Term Pregnancy Delivered                                                                                                Post partum procedures:None  Augmentation: AROM and Pitocin  Complications: None  Hospital course:  Onset of Labor With Unplanned C/S  31 y.o. yo G1P1001 at [redacted]w[redacted]d was admitted in Active Labor on 03/06/2016. Patient had a labor course significant for FTD. Membrane Rupture Time/Date: 1:08 PM ,03/06/2016   The patient went for cesarean section due to Arrest of Descent, and delivered a Viable infant,03/06/2016  Details of operation can be found in separate operative note. Patient had an uncomplicated postpartum course.  She is ambulating,tolerating a regular diet, passing flatus, and urinating well.  Patient is discharged home in stable condition 03/09/16.  Physical exam  Vitals:   03/07/16 1819 03/08/16 0543 03/08/16 1800 03/09/16 0610  BP: (!) 106/50 (!) 101/58 (!) 111/58 (!) 108/46  Pulse: 76 61 75 67  Resp: 18 18 18 18   Temp: 98.4 F (36.9 C) 98.2 F (36.8 C) 98.3 F (36.8 C) 98.1 F (36.7 C)  TempSrc:  Oral Oral Oral  SpO2: 99%     Weight:      Height:       General: alert, cooperative and no distress Lochia: appropriate Uterine Fundus: firm Incision: Dressing is clean, dry, and intact DVT Evaluation: No evidence of DVT seen on physical exam. Negative Homan's sign. Labs: Lab Results  Component Value Date   WBC 11.6 (H) 03/07/2016   HGB 10.0 (L) 03/07/2016   HCT 28.8 (L) 03/07/2016   MCV 88.6 03/07/2016   PLT 172 03/07/2016   No  flowsheet data found.  Discharge instruction: per After Visit Summary and "Baby and Me Booklet".  After visit meds:  Allergies as of 03/09/2016   No Known Allergies     Medication List    TAKE these medications   calcium carbonate 500 MG chewable tablet Commonly known as:  TUMS - dosed in mg elemental calcium Chew 2 tablets by mouth as needed for heartburn.   oxyCODONE 5 MG immediate release tablet Commonly known as:  Oxy IR/ROXICODONE Take 1 tablet (5 mg total) by mouth every 4 (four) hours as needed for moderate pain.   PRENATAL VITAMIN PO Take by mouth.       Diet: routine diet  Activity: Advance as tolerated. Pelvic rest for 6 weeks.   Outpatient follow up:6 weeks Follow up Appt:No future appointments. Follow up Visit:No Follow-up on file.  Postpartum contraception: Progesterone only pills  Newborn Data: Live born female  Birth Weight: 6 lb 10.7 oz (3025 g) APGAR: 8, 9  Baby Feeding: Breast Disposition:will  stay admitted for weight loss.   03/09/2016 Starla Link, CNM

## 2016-03-09 NOTE — Lactation Note (Signed)
This note was copied from a baby's chart. Lactation Consultation Note: Mother has been supplementing with a #5 fr feeding tube. Mother is giving 30 ml every 2-3 hours per MD order.   Mothers nipples are very sore, observed that mother has scab on both nipples. Mother was given comfort gels. She was also given shell to wear when needed.   Advised parents to finger feed to give mother a break from breastfeeding to allow nipples to heal. Mother to post pump for 15-20 mins every 2-3 hours. Encouraged mother to feed infant 8-12 times in 24 hours. Mother to feed with all feeding cues.   Mother has a Advertising account executive pump at home.   Mother to do good breast massage when milk coming in to soften breast. Wake infant well for feedings. Mother to ice breast for 15 mins to reduce swelling.   Mother advised to follow up with Lactation services as needed.    Patient Name: Jessica Wood [id number] Date: 03/09/2016     Maternal Data    Feeding Length of feed: 25 min  LATCH Score/Interventions                      Lactation Tools Discussed/Used     Consult Status      Darla Lesches 03/09/2016, 4:36 PM

## 2016-03-18 NOTE — Op Note (Signed)
Cesarean Section Procedure Note   [name]  03/06/2016  Indications: Failure to Descend   Pre-operative Diagnosis: * No pre-op diagnosis entered *.   Post-operative Diagnosis: Same   Surgeon: Surgeon(s) and Role:    * Crawford Givens, MD - Primary   Assistants: RNFA   Anesthesia: epidural   Procedure Details:  The patient was seen in the Holding Room. The risks, benefits, complications, treatment options, and expected outcomes were discussed with the patient. The patient concurred with the proposed plan, giving informed consent. identified as Jessica Wood and the procedure verified as C-Section Delivery. A Time Out was held and the above information confirmed.  After induction of anesthesia, the patient was draped and prepped in the usual sterile manner. A transverse incision was made and carried down through the subcutaneous tissue to the fascia. Fascial incision was made in the midline and extended transversely. The fascia was separated from the underlying rectus muscle superiorly and inferiorly. The peritoneum was identified and entered. Peritoneal incision was extended longitudinally with good visualization of bowel and bladder. The utero-vesical peritoneal reflection was incised transversely and the bladder flap was bluntly freed from the lower uterine segment.  An alexsis retractor was placed in the abdomen.   A low transverse uterine incision was made. Delivered from cephalic presentation was a  infant, with Apgar scores of 8 at one minute and 9 at five minutes. Cord ph was not sent the umbilical cord was clamped and cut cord blood was obtained for evaluation. The placenta was removed Intact and appeared normal. The uterine outline, tubes and ovaries appeared normal}. The uterine incision was closed with running locked sutures of 0Vicryl. A second layer 0 vicrlyl was used to imbricate the uterine incision    Hemostasis was observed. Lavage was carried out until clear. The alexsis was  removed.  The peritoneum was closed with 0 chromic.  The muscles were examined and any bleeders were made hemostatic using bovie cautery device.   The fascia was then reapproximated with running sutures of 0 vicryl.  The subcutaneous tissue was reapproximated  With interrupted stitches using 2-0 plain gut. The subcuticular closure was performed using 3-40monocryl     Instrument, sponge, and needle counts were correct prior the abdominal closure and were correct at the conclusion of the case.    Findings: infant was delivered from vtx presentation. The fluid was clear.  The uterus tubes and ovaries appeared normal.  There was a hematoma seen on the right side of the uterus near the broad ligamnet.  About 2 cm in size.  Did not expand during the procedure   Estimated Blood Loss: 1100cc   Total IV Fluids: 2150ml   Urine Output: 75CC OF clear urine  Specimens: placenta to path  Complications: no complications  Disposition: PACU - hemodynamically stable.   Maternal Condition: stable   Baby condition / location:  Couplet care / Skin to Skin  Attending Attestation: I performed the procedure.   Signed: Surgeon(s): Crawford Givens, MD

## 2016-07-19 NOTE — Addendum Note (Signed)
Addendum  created 07/19/16 0930 by Lyn Hollingshead, MD   Sign clinical note

## 2018-10-20 ENCOUNTER — Encounter (HOSPITAL_COMMUNITY): Payer: Self-pay | Admitting: *Deleted

## 2018-10-20 ENCOUNTER — Inpatient Hospital Stay (HOSPITAL_COMMUNITY)
Admission: AD | Admit: 2018-10-20 | Discharge: 2018-10-21 | Disposition: A | Discharge status: Home / Self Care | Payer: 59 | Attending: Obstetrics and Gynecology | Admitting: Obstetrics and Gynecology

## 2018-10-20 ENCOUNTER — Other Ambulatory Visit: Payer: Self-pay

## 2018-10-20 DIAGNOSIS — O208 Other hemorrhage in early pregnancy: Secondary | ICD-10-CM | POA: Insufficient documentation

## 2018-10-20 DIAGNOSIS — K219 Gastro-esophageal reflux disease without esophagitis: Secondary | ICD-10-CM | POA: Diagnosis not present

## 2018-10-20 DIAGNOSIS — O468X1 Other antepartum hemorrhage, first trimester: Secondary | ICD-10-CM

## 2018-10-20 DIAGNOSIS — O26891 Other specified pregnancy related conditions, first trimester: Secondary | ICD-10-CM | POA: Diagnosis present

## 2018-10-20 DIAGNOSIS — M419 Scoliosis, unspecified: Secondary | ICD-10-CM | POA: Diagnosis not present

## 2018-10-20 DIAGNOSIS — Z885 Allergy status to narcotic agent status: Secondary | ICD-10-CM | POA: Diagnosis not present

## 2018-10-20 DIAGNOSIS — D259 Leiomyoma of uterus, unspecified: Secondary | ICD-10-CM | POA: Diagnosis not present

## 2018-10-20 DIAGNOSIS — O99611 Diseases of the digestive system complicating pregnancy, first trimester: Secondary | ICD-10-CM | POA: Insufficient documentation

## 2018-10-20 DIAGNOSIS — R109 Unspecified abdominal pain: Secondary | ICD-10-CM | POA: Diagnosis not present

## 2018-10-20 DIAGNOSIS — O209 Hemorrhage in early pregnancy, unspecified: Secondary | ICD-10-CM

## 2018-10-20 DIAGNOSIS — O418X10 Other specified disorders of amniotic fluid and membranes, first trimester, not applicable or unspecified: Secondary | ICD-10-CM | POA: Diagnosis not present

## 2018-10-20 DIAGNOSIS — Z3A01 Less than 8 weeks gestation of pregnancy: Secondary | ICD-10-CM | POA: Insufficient documentation

## 2018-10-20 DIAGNOSIS — O3680X0 Pregnancy with inconclusive fetal viability, not applicable or unspecified: Secondary | ICD-10-CM | POA: Insufficient documentation

## 2018-10-20 DIAGNOSIS — O3411 Maternal care for benign tumor of corpus uteri, first trimester: Secondary | ICD-10-CM | POA: Insufficient documentation

## 2018-10-20 HISTORY — DX: Gastro-esophageal reflux disease without esophagitis: K21.9

## 2018-10-20 LAB — CBC
HCT: 41 % (ref 36.0–46.0)
Hemoglobin: 13.8 g/dL (ref 12.0–15.0)
MCH: 29.9 pg (ref 26.0–34.0)
MCHC: 33.7 g/dL (ref 30.0–36.0)
MCV: 88.9 fL (ref 80.0–100.0)
Platelets: 309 10*3/uL (ref 150–400)
RBC: 4.61 MIL/uL (ref 3.87–5.11)
RDW: 12 % (ref 11.5–15.5)
WBC: 10 10*3/uL (ref 4.0–10.5)
nRBC: 0 % (ref 0.0–0.2)

## 2018-10-20 NOTE — MAU Note (Signed)
SAYS LMP 7-26.  FIRST APPOINTMENT IS 10-7.   DID HPT ON Monday-POSITIVE.     SPOTTING  STARTED ON Monday NIGHT . ON Friday NIGHT HAD A BROWN WATERY D/C -    SHE CALLED OFFICE TODAY  AT 3 PM- TOLD  TO COME HERE  OR OFFICE  TOMORROW . HAS AN APPOINTMENT TOMORROW FOR SPOTTING  .  NOW FEELS PRESSURE AND MORE SPOTTING .  SPOTTING WHEN WIPED .

## 2018-10-21 ENCOUNTER — Encounter (HOSPITAL_COMMUNITY): Payer: Self-pay | Admitting: Advanced Practice Midwife

## 2018-10-21 ENCOUNTER — Inpatient Hospital Stay (HOSPITAL_COMMUNITY): Payer: 59

## 2018-10-21 ENCOUNTER — Other Ambulatory Visit (HOSPITAL_COMMUNITY): Payer: 59

## 2018-10-21 DIAGNOSIS — O418X10 Other specified disorders of amniotic fluid and membranes, first trimester, not applicable or unspecified: Secondary | ICD-10-CM

## 2018-10-21 DIAGNOSIS — O468X1 Other antepartum hemorrhage, first trimester: Secondary | ICD-10-CM

## 2018-10-21 DIAGNOSIS — O3680X0 Pregnancy with inconclusive fetal viability, not applicable or unspecified: Secondary | ICD-10-CM

## 2018-10-21 DIAGNOSIS — Z3A01 Less than 8 weeks gestation of pregnancy: Secondary | ICD-10-CM

## 2018-10-21 DIAGNOSIS — O209 Hemorrhage in early pregnancy, unspecified: Secondary | ICD-10-CM

## 2018-10-21 LAB — WET PREP, GENITAL
Clue Cells Wet Prep HPF POC: NONE SEEN
Sperm: NONE SEEN
Trich, Wet Prep: NONE SEEN
Yeast Wet Prep HPF POC: NONE SEEN

## 2018-10-21 LAB — HCG, QUANTITATIVE, PREGNANCY: hCG, Beta Chain, Quant, S: 28109 m[IU]/mL — ABNORMAL HIGH (ref <5)

## 2018-10-21 LAB — HIV ANTIBODY (ROUTINE TESTING W REFLEX): HIV Screen 4th Generation wRfx: NONREACTIVE

## 2018-10-21 NOTE — MAU Provider Note (Signed)
Chief Complaint: Abdominal Pain       SUBJECTIVE HPI: Jessica Wood is a 33 y.o. G1P1001 at [redacted]w[redacted]d by LMP who presents to maternity admissions reporting vaginal spotting this week .Had some brown watery discharge Friday and thinks her water might have broken.  Then had more spotting with pelvic pressure today.  Has appt tomorrow morning. . She denies vaginal itching/burning, urinary symptoms, h/a, dizziness, n/v, or fever/chills.    RN Note: SAYS LMP 7-26.  FIRST APPOINTMENT IS 10-7.   DID HPT ON Monday-POSITIVE.     SPOTTING  STARTED ON Monday NIGHT . ON Friday NIGHT HAD A BROWN WATERY D/C -    SHE CALLED OFFICE TODAY  AT 3 PM- TOLD  TO COME HERE  OR OFFICE  TOMORROW . HAS AN APPOINTMENT TOMORROW FOR SPOTTING  .  NOW FEELS PRESSURE AND MORE SPOTTING .  SPOTTING WHEN WIPED .    Past Medical History:  Diagnosis Date  . GERD (gastroesophageal reflux disease)   . Scoliosis    Past Surgical History:  Procedure Laterality Date  . CESAREAN SECTION N/A 03/06/2016   Procedure: CESAREAN SECTION;  Surgeon: Crawford Givens, MD;  Location: [facility];  Service: Obstetrics;  Laterality: N/A;  . NO PAST SURGERIES     Social History   Socioeconomic History  . Marital status: Married    Spouse name: Not on file  . Number of children: Not on file  . Years of education: Not on file  . Highest education level: Not on file  Occupational History  . Not on file  Social Needs  . Financial resource strain: Not on file  . Food insecurity    Worry: Not on file    Inability: Not on file  . Transportation needs    Medical: Not on file    Non-medical: Not on file  Tobacco Use  . Smoking status: Never Smoker  . Smokeless tobacco: Never Used  Substance and Sexual Activity  . Alcohol use: No  . Drug use: No  . Sexual activity: Yes  Lifestyle  . Physical activity    Days per week: Not on file    Minutes per session: Not on file  . Stress: Not on file  Relationships  . Social Clinical research associate on phone: Not on file    Gets together: Not on file    Attends religious service: Not on file    Active member of club or organization: Not on file    Attends meetings of clubs or organizations: Not on file    Relationship status: Not on file  . Intimate partner violence    Fear of current or ex partner: Not on file    Emotionally abused: Not on file    Physically abused: Not on file    Forced sexual activity: Not on file  Other Topics Concern  . Not on file  Social History Narrative  . Not on file   No current facility-administered medications on file prior to encounter.    Current Outpatient Medications on File Prior to Encounter  Medication Sig Dispense Refill  . Prenatal Vit-Fe Fumarate-FA (PRENATAL VITAMIN PO) Take by mouth.    . calcium carbonate (TUMS - DOSED IN MG ELEMENTAL CALCIUM) 500 MG chewable tablet Chew 2 tablets by mouth as needed for heartburn.     Marland Kitchen oxyCODONE (OXY IR/ROXICODONE) 5 MG immediate release tablet Take 1 tablet (5 mg total) by mouth every 4 (four) hours as needed for moderate pain. [place]  tablet 0   Allergies  Allergen Reactions  . Oxycodone Rash    I have reviewed patient's Past Medical Hx, Surgical Hx, Family Hx, Social Hx, medications and allergies.   ROS:  Review of Systems  Constitutional: Negative for chills and fever.  Respiratory: Negative for shortness of breath.   Gastrointestinal: Negative for constipation, diarrhea and nausea.  Genitourinary: Positive for pelvic pain (pressure) and vaginal bleeding (spotting).   Review of Systems  Other systems negative   Physical Exam  Physical Exam Patient Vitals for the past 24 hrs:  BP Temp Pulse Resp Height Weight  10/20/18 2321 (!) 110/53 98.7 F (37.1 C) 87 18 4' 11.5" (1.511 m) 47.1 kg   Constitutional: Well-developed, well-nourished female in no acute distress.  Cardiovascular: normal rate Respiratory: normal effort GI: Abd soft, non-tender. Pos BS x 4 MS: Extremities nontender,  no edema, normal ROM Neurologic: Alert and oriented x 4.  GU: Neg CVAT.  PELVIC EXAM: Cervix pink, visually closed, without lesion, scant white creamy discharge, vaginal walls and external genitalia normal    No blood visible Bimanual exam: Cervix 0/long/high, firm, anterior, neg CMT, uterus nontender, nonenlarged, adnexa without tenderness, enlargement, or mass  LAB RESULTS Results for orders placed or performed during the hospital encounter of 10/20/18 (from the past 24 hour(s))  CBC     Status: None   Collection Time: 10/20/18 11:29 PM  Result Value Ref Range   WBC 10.0 4.0 - 10.5 K/uL   RBC 4.61 3.87 - 5.11 MIL/uL   Hemoglobin 13.8 12.0 - 15.0 g/dL   HCT 41.0 36.0 - 46.0 %   MCV 88.9 80.0 - 100.0 fL   MCH 29.9 26.0 - 34.0 pg   MCHC 33.7 30.0 - 36.0 g/dL   RDW 12.0 11.5 - 15.5 %   Platelets 309 150 - 400 K/uL   nRBC 0.0 0.0 - 0.2 %  hCG, quantitative, pregnancy     Status: Abnormal   Collection Time: 10/20/18 11:29 PM  Result Value Ref Range   hCG, Beta Chain, Quant, S 28,109 (H) <5 mIU/mL  Wet prep, genital     Status: Abnormal   Collection Time: 10/21/18 12:45 AM  Result Value Ref Range   Yeast Wet Prep HPF POC NONE SEEN NONE SEEN   Trich, Wet Prep NONE SEEN NONE SEEN   Clue Cells Wet Prep HPF POC NONE SEEN NONE SEEN   WBC, Wet Prep HPF POC MODERATE (A) NONE SEEN   Sperm NONE SEEN     Ref. Range 03/06/2016 06:11  ABO/RH(D) Unknown AB POS   IMAGING US Ob Less Than 14 Weeks With Ob Transvaginal  Result Date: 10/21/2018 CLINICAL DATA:  Initial evaluation for acute vaginal bleeding, early pregnancy. EXAM: OBSTETRIC <14 WK [place] AND TRANSVAGINAL OB US TECHNIQUE: Both transabdominal and transvaginal ultrasound examinations were performed for complete evaluation of the gestation as well as the maternal uterus, adnexal regions, and pelvic cul-de-sac. Transvaginal technique was performed to assess early pregnancy. COMPARISON:  None. FINDINGS: Intrauterine gestational sac: Single Yolk  sac:  Present Embryo:  Suspected tiny fetal pole. Cardiac Activity: Not yet visualized given fetal pole size. Heart Rate: N/A  bpm MSD: 11.5 mm   5 w   6 d CRL:  1.87 mm.  Too small for dating. Subchorionic hemorrhage: Small subchorionic hemorrhage measuring 0.9 x 1.2 x 0.5 cm without mass effect. Maternal uterus/adnexae: Left ovary only seen transvaginally and is normal in appearance. Small corpus luteal cyst noted on the right. Small 1  cm fibroid noted within the posterior uterus. Trace free fluid noted within the pelvis. IMPRESSION: 1. Single IUP with internal yolk sac and possible early fetal pole, but to early to detect cardiac activity. Estimated gestational age [redacted] weeks and 6 days by mean sac diameter. Close interval follow-up ultrasound in 14 days to assess viability recommended. 2. Small subchorionic hemorrhage without associated mass effect. 3. 1 cm posterior uterine fibroid. Electronically Signed   By: Jeannine Boga M.D.   On: 10/21/2018 00:39     MAU Management/MDM: Ordered usual first trimester r/o ectopic labs.   Pelvic exam and cultures done Will check baseline Ultrasound to rule out ectopic.  This bleeding/pain can represent a normal pregnancy with bleeding, spontaneous abortion or even an ectopic which can be life-threatening.  The process as listed above helps to determine which of these is present.  Reviewed results with patient and her husband Recommend pelvic rest and observation with followup in office They had lots of questions about using a medication "to make it stick" (one used in [place]), wondering if eating green papaya yesterday caused this, and more.  I recommended she discuss with her provider more details about what to eat and not eat.  Advised this is probably not what caused the [facility].    ASSESSMENT 1. Pregnancy of unknown anatomic location   2. Vaginal bleeding in pregnancy, first trimester   3. Pregnancy of unknown anatomic location   4. Vaginal  bleeding in pregnancy, first trimester   Subchorionic hemorrhage S[place] intrauterine gestational sac with yolk sac and possible fetal pole  PLAN Discharge home Expectant management Followup in office Pelvic rest  Pt stable at time of discharge. Encouraged to return here or to other Urgent Care/ED if she develops worsening of symptoms, increase in pain, fever, or other concerning symptoms.    Hansel Feinstein CNM, MSN Certified Nurse-Midwife 10/21/2018  12:15 AM

## 2018-10-21 NOTE — Discharge Instructions (Signed)
Subchorionic Hematoma  A subchorionic hematoma is a gathering of blood between the outer wall of the embryo (chorion) and the inner wall of the womb (uterus). This condition can cause vaginal bleeding. If they cause little or no vaginal bleeding, early small hematomas usually shrink on their own and do not affect your baby or pregnancy. When bleeding starts later in pregnancy, or if the hematoma is larger or occurs in older pregnant women, the condition may be more serious. Larger hematomas may get bigger, which increases the chances of miscarriage. This condition also increases the risk of:  Premature separation of the placenta from the uterus.  Premature (preterm) labor.  Stillbirth. What are the causes? The exact cause of this condition is not known. It occurs when blood is trapped between the placenta and the uterine wall because the placenta has separated from the original site of implantation. What increases the risk? You are more likely to develop this condition if:  You were treated with fertility medicines.  You conceived through in vitro fertilization (IVF). What are the signs or symptoms? Symptoms of this condition include:  Vaginal spotting or bleeding.  Contractions of the uterus. These cause abdominal pain. Sometimes you may have no symptoms and the bleeding may only be seen when ultrasound images are taken (transvaginal ultrasound). How is this diagnosed? This condition is diagnosed based on a physical exam. This includes a pelvic exam. You may also have other tests, including:  Blood tests.  Urine tests.  Ultrasound of the abdomen. How is this treated? Treatment for this condition can vary. Treatment may include:  Watchful waiting. You will be monitored closely for any changes in bleeding. During this stage: ? The hematoma may be reabsorbed by the body. ? The hematoma may separate the fluid-filled space containing the embryo (gestational sac) from the wall of the  womb (endometrium).  Medicines.  Activity restriction. This may be needed until the bleeding stops. Follow these instructions at home:  Stay on bed rest if told to do so by your health care provider.  Do not lift anything that is heavier than 10 lbs. (4.5 kg) or as told by your health care provider.  Do not use any products that contain nicotine or tobacco, such as cigarettes and e-cigarettes. If you need help quitting, ask your health care provider.  Track and write down the number of pads you use each day and how soaked (saturated) they are.  Do not use tampons.  Keep all follow-up visits as told by your health care provider. This is important. Your health care provider may ask you to have follow-up blood tests or ultrasound tests or both. Contact a health care provider if:  You have any vaginal bleeding.  You have a fever. Get help right away if:  You have severe cramps in your stomach, back, abdomen, or pelvis.  You pass large clots or tissue. Save any tissue for your health care provider to look at.  You have more vaginal bleeding, and you faint or become lightheaded or weak. Summary  A subchorionic hematoma is a gathering of blood between the outer wall of the placenta and the uterus.  This condition can cause vaginal bleeding.  Sometimes you may have no symptoms and the bleeding may only be seen when ultrasound images are taken.  Treatment may include watchful waiting, medicines, or activity restriction. This information is not intended to replace advice given to you by your health care provider. Make sure you discuss any questions you  have with your health care provider. Document Released: 05/15/2006 Document Revised: 01/10/2017 Document Reviewed: 03/26/2016 Elsevier Patient Education  2020 [employer].   Activity Restriction During Pregnancy Your health care provider may recommend specific activity restrictions during pregnancy for a variety of reasons.  Activity restriction may require that you limit activities that require great effort, such as exercise, lifting, or sex. The type of activity restriction will vary for each person, depending on your risk or the problems you are having. Activity restriction may be recommended for a period of time until your baby is delivered. Why are activity restrictions recommended? Activity restriction may be recommended if:  Your placenta is partially or completely covering the opening of your cervix (placenta previa).  There is bleeding between the wall of the uterus and the amniotic sac in the first trimester of pregnancy (subchorionic hemorrhage).  You went into labor too early (preterm labor).  You have a history of miscarriage.  You have a condition that causes high blood pressure during pregnancy (preeclampsia or eclampsia).  You are pregnant with more than one baby.  Your baby is not growing well. What are the risks? The risks depend on your specific restriction. Strict bed rest has the most physical and emotional risks and is no longer routinely recommended. Risks of strict bed rest include:  Loss of muscle conditioning from not moving.  Blood clots.  Social isolation.  Depression.  Loss of income. Talk with your health care team about activity restriction to decide if it is best for you and your baby. Even if you are having problems during your pregnancy, you may be able to continue with normal levels of activity with careful monitoring by your health care team. Follow these instructions at home: If needed, based on your overall health and the health of your baby, your health care provider will decide which type of activity restriction is right for you. Activity restrictions may include:  Not lifting anything heavier than 10 pounds (4.5 kg).  Avoiding activities that take a lot of physical effort.  No lifting or straining.  Resting in a sitting position or lying down for periods of  time during the day. Pelvic rest may be recommended along with activity restrictions. If pelvic rest is recommended, then:  Do not have sex, an orgasm, or use sexual stimulation.  Do not use tampons. Do not douche. Do not put anything into your vagina.  Do not lift anything that is heavier than 10 lb (4.5 kg).  Avoid activities that require a lot of effort.  Avoid any activity in which your pelvic muscles could become strained, such as squatting. Questions to ask your health care provider  Why is my activity being limited?  How will activity restrictions affect my body?  Why is rest helpful for me and my baby?  What activities can I do?  When can I return to normal activities? When should I seek immediate medical care? Seek immediate medical care if you have:  Vaginal bleeding.  Vaginal discharge.  Cramping pain in your lower abdomen.  Regular contractions.  A low, dull backache. Summary  Your health care provider may recommend specific activity restrictions during pregnancy for a variety of reasons.  Activity restriction may require that you limit activities such as exercise, lifting, sex, or any other activity that requires great effort.  Discuss the risks and benefits of activity restriction with your health care team to decide if it is best for you and your baby.  Contact your  health care provider right away if you think you are having contractions, or if you notice vaginal bleeding, discharge, or cramping. This information is not intended to replace advice given to you by your health care provider. Make sure you discuss any questions you have with your health care provider. Document Released: 05/25/2010 Document Revised: 05/20/2017 Document Reviewed: 05/20/2017 Elsevier Patient Education  2020 [employer].

## 2018-10-22 LAB — GC/CHLAMYDIA PROBE AMP (~~LOC~~) NOT AT ARMC
Chlamydia: NEGATIVE
Neisseria Gonorrhea: NEGATIVE

## 2018-11-18 ENCOUNTER — Other Ambulatory Visit: Payer: Self-pay

## 2018-11-18 ENCOUNTER — Encounter (HOSPITAL_COMMUNITY): Payer: Self-pay | Admitting: *Deleted

## 2018-11-18 ENCOUNTER — Other Ambulatory Visit: Payer: Self-pay | Admitting: Obstetrics and Gynecology

## 2018-11-18 DIAGNOSIS — O039 Complete or unspecified spontaneous abortion without complication: Secondary | ICD-10-CM | POA: Diagnosis present

## 2018-11-18 DIAGNOSIS — O034 Incomplete spontaneous abortion without complication: Secondary | ICD-10-CM | POA: Diagnosis present

## 2018-11-18 NOTE — H&P (Addendum)
Jessica Wood is a 33 y.o. female, [id number] presents on 11/19/18 for scheduled D&E due to retained products s/p SAB at 8 weeks by dates, 6 weeks by size.  Had initial [place] at 5 weeks, with IUGS noted and tiny fetal pole, but no HR able to be visualized.    [place] on 9/29 was for f/u, as patient reported some bleeding that week.  Seen 9/29 in office with dx of SAB at 8 5/7 weeks by gestation, 6 1/7 weeks by embryonic size, with no FHR. Elected Psychiatrist, done 9/30, with bleeding and cramping. Seen 10/5 for f/u, with US showing thickend endometrium and small adherent vascular mass at fundus, c/w retained POC, no fetal pole at that time. Known AB+ blood type from pregnancy 2017. I consulted with Dr. Mancel Bale at that time, with recommendation for D&E. Patient declined D&E at that time, wanted to try Methergine course. QHCG 5704 and CBC WNL on 10/5. Used Methergine on 10-5/10-6, and seen in office on 10/7 for f/u. Reports passed large blood clot before taking Methergine course, but minimal bleeding now. No significant pain. Denies fever or abdominal pain.  [place] 11/18/18--retroverted uterus, 8.4 cm from fundus to external os. Endometrium remains thickened and hypervascular, c/w retained POC, measures 22.4 mm on exam. RO and LO WNL. Trace FF in CDS, bilateral adnexas WNL.  Findings reviewed, reiterated Dr. Rose Fillers recommendation for D&E, patient and husband agreeable with plan.  Scheduled today with Dr. Mancel Bale per patient request.  Patient Active Problem List   Diagnosis Date Noted  . SAB (spontaneous abortion)--11/10/18 11/18/2018  . Retained products of conception after miscarriage 11/18/2018  . S/P primary low transverse C-section 03/08/2016  . H/O scoliosis 03/08/2016     OB History    Gravida  2   Para  1   Term  1   Preterm      AB      Living  1     SAB      TAB      Ectopic      Multiple  0   Live Births  1         2018--Primary C/S at 55 4/7 weeks, with [facility],  due to FTD, female, weight 6+10. Current--SAB dx at 8 weeks by dates, 6 weeks by size.  Past Medical History:  Diagnosis Date  . GERD (gastroesophageal reflux disease)   . Scoliosis    Past Surgical History:  Procedure Laterality Date  . CESAREAN SECTION N/A 03/06/2016   Procedure: CESAREAN SECTION;  Surgeon: Crawford Givens, MD;  Location: [facility];  Service: Obstetrics;  Laterality: N/A;  . NO PAST SURGERIES     Family History: family history is not on file. Family hx of asthma, diabetes, HTN, cancer, kidney disease, TB, stroke.  Social History:  reports that she has never smoked. She has never used smokeless tobacco. She reports that she does not drink alcohol or use drugs. She is Asian, from the [place], married to [place], who is involved and supportive.  She is graduate educated.   ROS:  Occasional mild cramping, minimal bleeding  Allergies  Allergen Reactions  . Oxycodone Rash       Last menstrual period 09/06/2018, unknown if currently breastfeeding.  Chest clear Heart RRR without murmur Abd soft, NT Pelvic: Minimal bleeding, cervix closed at present Ext: WNL   Prenatal labs: ABO, Rh:  AB+ Pap normal 09/2018 Hgb 13.3/plat 317 on 10/5 QHCG 5704 on 10/5   Assessment/Plan: Retained products s/p  early SAB For D&E today AB+  Plan: Admit to Pre-op per consult with Dr. Mancel Bale Routine [facility] pre-op orders Support to patient for loss  Donnel Saxon CNM, [place] 11/18/2018, 2:01 PM   Pt reviewed her sxs with me and I reviewed the procedure.  R/B/A reviewed and consent s/w.  Questions answered

## 2018-11-18 NOTE — Progress Notes (Signed)
Patient denies shortness of breath, fever, cough and chest pain. Patient is her for retained products s/p SAB at 8 weeks.  Blood type AB positive.  PCP - Dr Juliann Pares Cardiologist - denies  Chest x-ray - Denies EKG - Denies Stress Test - Denies ECHO - Denies Cardiac Cath - Denies  Anesthesia review: No  STOP taking any Aspirin (unless otherwise instructed by your surgeon), Aleve, Naproxen, Ibuprofen, Motrin, Advil, Goody's, BC's, all herbal medications, fish oil, and all vitamins.   Coronavirus Screening Have you or your husband experienced the following symptoms:  Cough yes/no: No Fever (>100.32F)  yes/no: No Runny nose yes/no: No Sore throat yes/no: No Difficulty breathing/shortness of breath  yes/no: No  Have you or your husband  traveled in the last 14 days and where? yes/no: No  Patient verbalized understanding of instructions that were given her via phone.

## 2018-11-19 ENCOUNTER — Ambulatory Visit (HOSPITAL_COMMUNITY)
Admission: RE | Admit: 2018-11-19 | Discharge: 2018-11-19 | Disposition: A | Discharge status: Home / Self Care | Payer: 59 | Attending: Obstetrics and Gynecology | Admitting: Obstetrics and Gynecology

## 2018-11-19 ENCOUNTER — Encounter (HOSPITAL_COMMUNITY)
Admission: RE | Disposition: A | Discharge status: Home / Self Care | Payer: Self-pay | Source: Home / Self Care | Attending: Obstetrics and Gynecology

## 2018-11-19 ENCOUNTER — Other Ambulatory Visit: Payer: Self-pay

## 2018-11-19 ENCOUNTER — Encounter (HOSPITAL_COMMUNITY): Payer: Self-pay

## 2018-11-19 ENCOUNTER — Ambulatory Visit (HOSPITAL_COMMUNITY): Payer: 59 | Admitting: Certified Registered Nurse Anesthetist

## 2018-11-19 DIAGNOSIS — K219 Gastro-esophageal reflux disease without esophagitis: Secondary | ICD-10-CM | POA: Diagnosis not present

## 2018-11-19 DIAGNOSIS — Z20828 Contact with and (suspected) exposure to other viral communicable diseases: Secondary | ICD-10-CM | POA: Diagnosis not present

## 2018-11-19 DIAGNOSIS — O039 Complete or unspecified spontaneous abortion without complication: Secondary | ICD-10-CM | POA: Diagnosis present

## 2018-11-19 DIAGNOSIS — O034 Incomplete spontaneous abortion without complication: Secondary | ICD-10-CM | POA: Diagnosis present

## 2018-11-19 HISTORY — PX: DILATION AND EVACUATION: SHX1459

## 2018-11-19 LAB — CBC
HCT: 39.2 % (ref 36.0–46.0)
Hemoglobin: 12.8 g/dL (ref 12.0–15.0)
MCH: 30.3 pg (ref 26.0–34.0)
MCHC: 32.7 g/dL (ref 30.0–36.0)
MCV: 92.7 fL (ref 80.0–100.0)
Platelets: 293 10*3/uL (ref 150–400)
RBC: 4.23 MIL/uL (ref 3.87–5.11)
RDW: 11.9 % (ref 11.5–15.5)
WBC: 7.8 10*3/uL (ref 4.0–10.5)
nRBC: 0 % (ref 0.0–0.2)

## 2018-11-19 LAB — SARS CORONAVIRUS 2 BY RT PCR (HOSPITAL ORDER, PERFORMED IN ~~LOC~~ HOSPITAL LAB): SARS Coronavirus 2: NEGATIVE

## 2018-11-19 SURGERY — DILATION AND EVACUATION, UTERUS
Anesthesia: Monitor Anesthesia Care | Site: Vagina

## 2018-11-19 MED ORDER — LIDOCAINE HCL 2 % IJ SOLN
INTRAMUSCULAR | Status: DC | PRN
  Administered 2018-11-19: 10 mL

## 2018-11-19 MED ORDER — FENTANYL CITRATE (PF) 100 MCG/2ML IJ SOLN: 25.0000 ug | INTRAMUSCULAR | Status: DC | PRN

## 2018-11-19 MED ORDER — MIDAZOLAM HCL 2 MG/2ML IJ SOLN
INTRAMUSCULAR | Status: AC
  Filled 2018-11-19: qty 2

## 2018-11-19 MED ORDER — LIDOCAINE HCL 2 % IJ SOLN
INTRAMUSCULAR | Status: AC
  Filled 2018-11-19: qty 20

## 2018-11-19 MED ORDER — PROPOFOL 500 MG/50ML IV EMUL
INTRAVENOUS | Status: DC | PRN
  Administered 2018-11-19: 100 ug/kg/min via INTRAVENOUS

## 2018-11-19 MED ORDER — CEFAZOLIN SODIUM-DEXTROSE 2-3 GM-%(50ML) IV SOLR
INTRAVENOUS | Status: DC | PRN
  Administered 2018-11-19: 2 g via INTRAVENOUS

## 2018-11-19 MED ORDER — PROPOFOL 10 MG/ML IV BOLUS
INTRAVENOUS | Status: AC
  Filled 2018-11-19: qty 40

## 2018-11-19 MED ORDER — FENTANYL CITRATE (PF) 250 MCG/5ML IJ SOLN
INTRAMUSCULAR | Status: AC
  Filled 2018-11-19: qty 5

## 2018-11-19 MED ORDER — PROMETHAZINE HCL 25 MG/ML IJ SOLN: 6.2500 mg | INTRAMUSCULAR | Status: DC | PRN

## 2018-11-19 MED ORDER — CEFAZOLIN SODIUM-DEXTROSE 2-4 GM/100ML-% IV SOLN
INTRAVENOUS | Status: AC
  Filled 2018-11-19: qty 100

## 2018-11-19 MED ORDER — HYDROCODONE-ACETAMINOPHEN 5-325 MG PO TABS: 1.0000 | ORAL_TABLET | Freq: Four times a day (QID) | ORAL | 0 refills | Status: DC | PRN

## 2018-11-19 MED ORDER — PROPOFOL 10 MG/ML IV BOLUS
INTRAVENOUS | Status: DC | PRN
  Administered 2018-11-19: 25 mg via INTRAVENOUS

## 2018-11-19 MED ORDER — FENTANYL CITRATE (PF) 100 MCG/2ML IJ SOLN
INTRAMUSCULAR | Status: DC | PRN
  Administered 2018-11-19 (×2): 50 ug via INTRAVENOUS

## 2018-11-19 MED ORDER — RANITIDINE HCL 300 MG PO TABS: 150.0000 mg | ORAL_TABLET | Freq: Every day | ORAL | Status: DC

## 2018-11-19 MED ORDER — ONDANSETRON HCL 4 MG/2ML IJ SOLN
INTRAMUSCULAR | Status: DC | PRN
  Administered 2018-11-19: 4 mg via INTRAVENOUS

## 2018-11-19 MED ORDER — MIDAZOLAM HCL 5 MG/5ML IJ SOLN
INTRAMUSCULAR | Status: DC | PRN
  Administered 2018-11-19: 2 mg via INTRAVENOUS

## 2018-11-19 MED ORDER — LACTATED RINGERS IV SOLN
INTRAVENOUS | Status: DC
  Administered 2018-11-19: 12:00:00 via INTRAVENOUS

## 2018-11-19 SURGICAL SUPPLY — 22 items
CATH ROBINSON RED A/P 16FR (CATHETERS) ×3 IMPLANT
DECANTER SPIKE VIAL GLASS SM (MISCELLANEOUS) ×3 IMPLANT
FILTER UTR ASPR ASSEMBLY (MISCELLANEOUS) ×3 IMPLANT
GLOVE BIO SURGEON STRL SZ7.5 (GLOVE) ×3 IMPLANT
GLOVE BIOGEL PI IND STRL 7.0 (GLOVE) ×1 IMPLANT
GLOVE BIOGEL PI IND STRL 7.5 (GLOVE) ×1 IMPLANT
GLOVE BIOGEL PI INDICATOR 7.0 (GLOVE) ×2
GLOVE BIOGEL PI INDICATOR 7.5 (GLOVE) ×2
GOWN STRL REUS W/ TWL LRG LVL3 (GOWN DISPOSABLE) ×2 IMPLANT
GOWN STRL REUS W/TWL LRG LVL3 (GOWN DISPOSABLE) ×4
HOSE CONNECTING 18IN BERKELEY (TUBING) ×3 IMPLANT
KIT BERKELEY 1ST TRIMESTER 3/8 (MISCELLANEOUS) ×6 IMPLANT
NS IRRIG 1000ML POUR BTL (IV SOLUTION) ×3 IMPLANT
PACK VAGINAL MINOR WOMEN LF (CUSTOM PROCEDURE TRAY) ×3 IMPLANT
PAD OB MATERNITY 4.3X12.25 (PERSONAL CARE ITEMS) ×3 IMPLANT
SET BERKELEY SUCTION TUBING (SUCTIONS) ×3 IMPLANT
TOWEL GREEN STERILE FF (TOWEL DISPOSABLE) ×6 IMPLANT
UNDERPAD 30X30 (UNDERPADS AND DIAPERS) ×3 IMPLANT
VACURETTE 10 RIGID CVD (CANNULA) IMPLANT
VACURETTE 7MM CVD STRL WRAP (CANNULA) ×2 IMPLANT
VACURETTE 8 RIGID CVD (CANNULA) IMPLANT
VACURETTE 9 RIGID CVD (CANNULA) IMPLANT

## 2018-11-19 NOTE — Anesthesia Procedure Notes (Signed)
Procedure Name: MAC Date/Time: 11/19/2018 1:45 PM Performed by: Imagene Riches, CRNA Pre-anesthesia Checklist: Patient identified, Emergency Drugs available, Suction available and Patient being monitored Patient Re-evaluated:Patient Re-evaluated prior to induction Oxygen Delivery Method: Nasal cannula

## 2018-11-19 NOTE — Op Note (Signed)
Preop Diagnosis: Retained Products of Conception   Postop Diagnosis: Retained products of conception   Procedure: SUCTION D&C/DILATATION AND EVACUATION   Anesthesia: General   Anesthesiologist: Audry Pili, MD   Attending: Everett Graff, MD   Assistant: N/a  Findings: Mod POCs  Pathology: POCs  Fluids: 400 cc  UOP: Voided prior to procedure  EBL: 10 cc  Complications: None  Procedure: The patient was taken to the operating room after the risks benefits and alternatives were discussed with the patient, the patient verbalized understanding and consent signed and witnessed.  The patient was placed under MAC anesthesia, prepped and draped in the normal sterile fashion and a time out was performed.  A bivalve speculum was placed in the patient's vagina and the anterior lip of the cervix grasped with a single-tooth tenaculum. A paracervical block was administered using a total of 10 cc of 2% lidocaine.  The uterus was sounded to 8 cm and a size 7 suction curette was used. Suction curettage was performed until minimal tissue returned. Sharp curettage was performed until a gritty texture was noted. Suction curettage was performed once again to remove any remaining debris. All instruments were removed. The count was correct. The patient was transferred to the recovery room in good condition.

## 2018-11-19 NOTE — Anesthesia Preprocedure Evaluation (Addendum)
Anesthesia Evaluation  Patient identified by MRN, date of birth, ID band Patient awake    Reviewed: Allergy & Precautions, NPO status , Patient's Chart, lab work & pertinent test results  History of Anesthesia Complications Negative for: history of anesthetic complications  Airway Mallampati: I  TM Distance: >3 FB Neck ROM: Full    Dental  (+) Dental Advisory Given, Teeth Intact   Pulmonary neg pulmonary ROS,    Pulmonary exam normal        Cardiovascular negative cardio ROS Normal cardiovascular exam     Neuro/Psych negative neurological ROS  negative psych ROS   GI/Hepatic Neg liver ROS, GERD  Medicated and Controlled,  Endo/Other  negative endocrine ROS  Renal/GU negative Renal ROS     Musculoskeletal  Scoliosis    Abdominal   Peds  Hematology negative hematology ROS (+)   Anesthesia Other Findings   Reproductive/Obstetrics  Retained POC                             Anesthesia Physical Anesthesia Plan  ASA: II  Anesthesia Plan: MAC   Post-op Pain Management:    Induction: Intravenous  PONV Risk Score and Plan: 2 and Propofol infusion and Treatment may vary due to age or medical condition  Airway Management Planned: Natural Airway and Simple Face Mask  Additional Equipment: None  Intra-op Plan:   Post-operative Plan:   Informed Consent: I have reviewed the patients History and Physical, chart, labs and discussed the procedure including the risks, benefits and alternatives for the proposed anesthesia with the patient or authorized representative who has indicated his/her understanding and acceptance.       Plan Discussed with: CRNA and Anesthesiologist  Anesthesia Plan Comments:        Anesthesia Quick Evaluation

## 2018-11-19 NOTE — Transfer of Care (Signed)
Immediate Anesthesia Transfer of Care Note  Patient: Kadasha Mall  Procedure(s) Performed: SUCTION DILATATION AND EVACUATION (N/A Vagina )  Patient Location: PACU  Anesthesia Type:General  Level of Consciousness: awake and alert   Airway & Oxygen Therapy: Patient Spontanous Breathing and Patient connected to nasal cannula oxygen  Post-op Assessment: Report given to RN and Post -op Vital signs reviewed and stable  Post vital signs: Reviewed and stable  Last Vitals:  Vitals Value Taken Time  BP    Temp    Pulse 92 11/19/18 1409  Resp 16 11/19/18 1409  SpO2 100 % 11/19/18 1409  Vitals shown include unvalidated device data.  Last Pain:  Vitals:   11/19/18 1123  TempSrc: Oral  PainSc: 0-No pain         Complications: No apparent anesthesia complications

## 2018-11-19 NOTE — Anesthesia Postprocedure Evaluation (Signed)
Anesthesia Post Note  Patient: Jessica Wood  Procedure(s) Performed: SUCTION DILATATION AND EVACUATION (N/A Vagina )     Patient location during evaluation: PACU Anesthesia Type: MAC Level of consciousness: awake and alert Pain management: pain level controlled Vital Signs Assessment: post-procedure vital signs reviewed and stable Respiratory status: spontaneous breathing, nonlabored ventilation and respiratory function stable Cardiovascular status: stable and blood pressure returned to baseline Anesthetic complications: no    Last Vitals:  Vitals:   11/19/18 1410 11/19/18 1425  BP: 99/70 102/62  Pulse: 89 86  Resp: 14 13  Temp: 36.6 C   SpO2: 100% 100%    Last Pain:  Vitals:   11/19/18 1410  TempSrc:   PainSc: 0-No pain                 Audry Pili

## 2018-11-20 ENCOUNTER — Encounter (HOSPITAL_COMMUNITY): Payer: Self-pay | Admitting: Obstetrics and Gynecology

## 2018-11-20 LAB — SURGICAL PATHOLOGY

## 2019-02-12 NOTE — L&D Delivery Note (Addendum)
Operative Delivery Note  Patient pushed for 3 1/2 hours.  There was fetal tachycardia and maternal exhaustion.  Discussed with patient operative delivery with a vacuum. Verbal consent: obtained from patient.   Risks and benefits discussed in detail.  Risks include, but are not limited to the risks of anesthesia, bleeding, infection, damage to maternal tissues, fetal cephalhematoma.  There is also the risk of inability to effect vaginal delivery of the head, or shoulder dystocia that cannot be resolved by established maneuvers, leading to the need for emergency cesarean section.  Patient voiced understanding and agreed to proceed.   Head determined to be direct OA. Bladder was just previously emptied by removal of foley catheter. Epidural was found to be adequate. Vertex was +2 station.  The Kiwi vacuum was placed.  With maternal effort and 3 pulls,  At 11:00 PM a viable and healthy female was delivered via VE. Presentation: vertex; Position: Left, Occiput,, Anterior.Cord was double clamped and cut and the infant handed to the waiting Pediatricians.  Infant was noted to be moving all four extremities and crying vigorously. Cord blood was obtained. The placenta delivered spontaneously intact 3 vessels noted.     Uterine atony was alleviated with IV pitocin and massage and one dose of IM methergine it was alleviated.     Second degree vaginal and perineal laceration repaired in routine fashion with 2-0 vicryl and 3-0 chromic. Right labial tear repaired with 3-0 chromic.     APGAR: 8, 9; weight pending.   Placenta status: intact 3 vessels.    Anesthesia: Epidural  Instruments: Kiwi vacuum Episiotomy: None Lacerations: 2nd degree Suture Repair: 2.0 vicryl and 3-0 chromic Est. Blood Loss (mL): 609  Mom to postpartum.  Baby to Couplet care / Skin to Skin.  Jessica Wood 01/08/2020, 12:06 AM  (Delivery note amended to reflect VE delivery as type of delivery (prior note listed Vaginal, Spontaneous),  but Dr. Tyler Aas narrative note verified VE use. Donnel Saxon, CNM 01/08/20 [phone]

## 2019-05-20 ENCOUNTER — Inpatient Hospital Stay (HOSPITAL_COMMUNITY)
Admission: AD | Admit: 2019-05-20 | Discharge: 2019-05-20 | Disposition: A | Discharge status: Home / Self Care | Payer: 59 | Attending: Obstetrics and Gynecology | Admitting: Obstetrics and Gynecology

## 2019-05-20 ENCOUNTER — Other Ambulatory Visit: Payer: Self-pay

## 2019-05-20 ENCOUNTER — Encounter (HOSPITAL_COMMUNITY): Payer: Self-pay | Admitting: Obstetrics and Gynecology

## 2019-05-20 ENCOUNTER — Inpatient Hospital Stay (HOSPITAL_COMMUNITY): Payer: 59

## 2019-05-20 DIAGNOSIS — O209 Hemorrhage in early pregnancy, unspecified: Secondary | ICD-10-CM | POA: Insufficient documentation

## 2019-05-20 DIAGNOSIS — Z885 Allergy status to narcotic agent status: Secondary | ICD-10-CM | POA: Insufficient documentation

## 2019-05-20 DIAGNOSIS — Z3A01 Less than 8 weeks gestation of pregnancy: Secondary | ICD-10-CM | POA: Diagnosis not present

## 2019-05-20 DIAGNOSIS — O26891 Other specified pregnancy related conditions, first trimester: Secondary | ICD-10-CM | POA: Insufficient documentation

## 2019-05-20 DIAGNOSIS — R109 Unspecified abdominal pain: Secondary | ICD-10-CM | POA: Diagnosis not present

## 2019-05-20 DIAGNOSIS — Z3491 Encounter for supervision of normal pregnancy, unspecified, first trimester: Secondary | ICD-10-CM

## 2019-05-20 DIAGNOSIS — O26899 Other specified pregnancy related conditions, unspecified trimester: Secondary | ICD-10-CM

## 2019-05-20 LAB — URINALYSIS, ROUTINE W REFLEX MICROSCOPIC
Bacteria, UA: NONE SEEN
Bilirubin Urine: NEGATIVE
Glucose, UA: NEGATIVE mg/dL
Hgb urine dipstick: NEGATIVE
Ketones, ur: NEGATIVE mg/dL
Nitrite: NEGATIVE
Protein, ur: NEGATIVE mg/dL
Specific Gravity, Urine: 1.023 (ref 1.005–1.030)
pH: 6 (ref 5.0–8.0)

## 2019-05-20 LAB — CBC
HCT: 39.4 % (ref 36.0–46.0)
Hemoglobin: 13 g/dL (ref 12.0–15.0)
MCH: 29.5 pg (ref 26.0–34.0)
MCHC: 33 g/dL (ref 30.0–36.0)
MCV: 89.5 fL (ref 80.0–100.0)
Platelets: 267 10*3/uL (ref 150–400)
RBC: 4.4 MIL/uL (ref 3.87–5.11)
RDW: 12 % (ref 11.5–15.5)
WBC: 8.8 10*3/uL (ref 4.0–10.5)
nRBC: 0 % (ref 0.0–0.2)

## 2019-05-20 LAB — WET PREP, GENITAL
Clue Cells Wet Prep HPF POC: NONE SEEN
Sperm: NONE SEEN
Trich, Wet Prep: NONE SEEN
Yeast Wet Prep HPF POC: NONE SEEN

## 2019-05-20 LAB — HCG, QUANTITATIVE, PREGNANCY: hCG, Beta Chain, Quant, S: 63724 m[IU]/mL — ABNORMAL HIGH (ref <5)

## 2019-05-20 LAB — POCT PREGNANCY, URINE: Preg Test, Ur: POSITIVE — AB

## 2019-05-20 NOTE — Progress Notes (Signed)
K. Kooistra CNM in earlier to discuss test results and d/c plan. Written and verbal d/c instructions given and understanding voiced.

## 2019-05-20 NOTE — MAU Note (Signed)
.   Jessica Wood is a 34 y.o. at [redacted]w[redacted]d here in MAU reporting: she went to [place] today for her 2nd quant and she started having vaginal spotting right before her appointment and heaviness in her lower abdomen LMP: 03/29/19 Onset of complaint: ongoing Pain score: 3  Vitals:   05/20/19 1730  BP: 131/63  Pulse: 83  Resp: 16  Temp: 98.3 F (36.8 C)  SpO2: 100%     FHT: Lab orders placed from triage: UA/UPT

## 2019-05-20 NOTE — Discharge Instructions (Signed)
First Trimester of Pregnancy  The first trimester of pregnancy is from week 1 until the end of week 13 (months 1 through 3). During this time, your baby will begin to develop inside you. At 6-8 weeks, the eyes and face are formed, and the heartbeat can be seen on ultrasound. At the end of 12 weeks, all the baby's organs are formed. Prenatal care is all the medical care you receive before the birth of your baby. Make sure you get good prenatal care and follow all of your doctor's instructions. Follow these instructions at home: Medicines  Take over-the-counter and prescription medicines only as told by your doctor. Some medicines are safe and some medicines are not safe during pregnancy.  Take a prenatal vitamin that contains at least 600 micrograms (mcg) of folic acid.  If you have trouble pooping (constipation), take medicine that will make your stool soft (stool softener) if your doctor approves. Eating and drinking   Eat regular, healthy meals.  Your doctor will tell you the amount of weight gain that is right for you.  Avoid raw meat and uncooked cheese.  If you feel sick to your stomach (nauseous) or throw up (vomit): ? Eat 4 or 5 small meals a day instead of 3 large meals. ? Try eating a few soda crackers. ? Drink liquids between meals instead of during meals.  To prevent constipation: ? Eat foods that are high in fiber, like fresh fruits and vegetables, whole grains, and beans. ? Drink enough fluids to keep your pee (urine) clear or pale yellow. Activity  Exercise only as told by your doctor. Stop exercising if you have cramps or pain in your lower belly (abdomen) or low back.  Do not exercise if it is too hot, too humid, or if you are in a place of great height (high altitude).  Try to avoid standing for long periods of time. Move your legs often if you must stand in one place for a long time.  Avoid heavy lifting.  Wear low-heeled shoes. Sit and stand up  straight.  You can have sex unless your doctor tells you not to. Relieving pain and discomfort  Wear a good support bra if your breasts are sore.  Take warm water baths (sitz baths) to soothe pain or discomfort caused by hemorrhoids. Use hemorrhoid cream if your doctor says it is okay.  Rest with your legs raised if you have leg cramps or low back pain.  If you have puffy, bulging veins (varicose veins) in your legs: ? Wear support hose or compression stockings as told by your doctor. ? Raise (elevate) your feet for 15 minutes, 3-4 times a day. ? Limit salt in your food. Prenatal care  Schedule your prenatal visits by the twelfth week of pregnancy.  Write down your questions. Take them to your prenatal visits.  Keep all your prenatal visits as told by your doctor. This is important. Safety  Wear your seat belt at all times when driving.  Make a list of emergency phone numbers. The list should include numbers for family, friends, the hospital, and police and fire departments. General instructions  Ask your doctor for a referral to a local prenatal class. Begin classes no later than at the start of month 6 of your pregnancy.  Ask for help if you need counseling or if you need help with nutrition. Your doctor can give you advice or tell you where to go for help.  Do not use hot tubs, steam   rooms, or saunas.  Do not douche or use tampons or scented sanitary pads.  Do not cross your legs for long periods of time.  Avoid all herbs and alcohol. Avoid drugs that are not approved by your doctor.  Do not use any tobacco products, including cigarettes, chewing tobacco, and electronic cigarettes. If you need help quitting, ask your doctor. You may get counseling or other support to help you quit.  Avoid cat litter boxes and soil used by cats. These carry germs that can cause birth defects in the baby and can cause a loss of your baby (miscarriage) or stillbirth.  Visit your dentist.  At home, brush your teeth with a soft toothbrush. Be gentle when you floss. Contact a doctor if:  You are dizzy.  You have mild cramps or pressure in your lower belly.  You have a nagging pain in your belly area.  You continue to feel sick to your stomach, you throw up, or you have watery poop (diarrhea).  You have a bad smelling fluid coming from your vagina.  You have pain when you pee (urinate).  You have increased puffiness (swelling) in your face, hands, legs, or ankles. Get help right away if:  You have a fever.  You are leaking fluid from your vagina.  You have spotting or bleeding from your vagina.  You have very bad belly cramping or pain.  You gain or lose weight rapidly.  You throw up blood. It may look like coffee grounds.  You are around people who have German measles, fifth disease, or chickenpox.  You have a very bad headache.  You have shortness of breath.  You have any kind of trauma, such as from a fall or a car accident. Summary  The first trimester of pregnancy is from week 1 until the end of week 13 (months 1 through 3).  To take care of yourself and your unborn baby, you will need to eat healthy meals, take medicines only if your doctor tells you to do so, and do activities that are safe for you and your baby.  Keep all follow-up visits as told by your doctor. This is important as your doctor will have to ensure that your baby is healthy and growing well. This information is not intended to replace advice given to you by your health care provider. Make sure you discuss any questions you have with your health care provider. Document Revised: 05/21/2018 Document Reviewed: 02/06/2016 Elsevier Patient Education  2020 [employer]vier Inc.  

## 2019-05-20 NOTE — MAU Provider Note (Addendum)
Patient Jessica Wood 34 y.o. [id number]  at [redacted]w[redacted]d here with complaints of bleeding that started on Friday (6 days ago). She denies abdominal pain, abnormal discharge, NV. She is very concerned because she has a history of a miscarriage. She was seen at [facility] on Friday and had bHCG drawn as well as speculum exam but no [place]. Patient is tearful bc [facility] can't see her  History     CSN: [id number]  Arrival date and time: 05/20/19 1711   None     Chief Complaint  Patient presents with  . Possible Pregnancy  . Vaginal Bleeding   Vaginal Bleeding The patient's primary symptoms include vaginal bleeding. This is a new problem. The problem occurs intermittently. Pertinent negatives include no constipation, diarrhea, urgency or vomiting. The vaginal discharge was bloody. The vaginal bleeding is spotting.  She reports pink spotting on her pantyliner; had a few small clots the first day but none since. She noticed that she had more bleeding after her vaginal exam on Tuesday.   OB History    Gravida  3   Para  1   Term  1   Preterm      AB  1   Living  1     SAB  1   TAB      Ectopic      Multiple  0   Live Births  1           Past Medical History:  Diagnosis Date  . GERD (gastroesophageal reflux disease)   . Scoliosis     Past Surgical History:  Procedure Laterality Date  . CESAREAN SECTION N/A 03/06/2016   Procedure: CESAREAN SECTION;  Surgeon: Crawford Givens, MD;  Location: [place];  Service: Obstetrics;  Laterality: N/A;  . DILATION AND EVACUATION N/A 11/19/2018   Procedure: SUCTION DILATATION AND EVACUATION;  Surgeon: Everett Graff, MD;  Location: [place];  Service: Gynecology;  Laterality: N/A;    History reviewed. No pertinent family history.  Social History   Tobacco Use  . Smoking status: Never Smoker  . Smokeless tobacco: Never Used  Substance Use Topics  . Alcohol use: No  . Drug use: No    Allergies:  Allergies  Allergen Reactions  .  Oxycodone Rash    Medications Prior to Admission  Medication Sig Dispense Refill Last Dose  . Prenatal Vit-Fe Fumarate-FA (PRENATAL VITAMIN PO) Take 1 tablet by mouth daily.    05/20/2019 at Unknown time  . progesterone 200 MG SUPP Place 200 mg vaginally at bedtime.   05/19/2019 at Unknown time  . Ascorbic Acid (VITAMIN C) 1000 MG tablet Take 1,000 mg by mouth daily.     . calcium carbonate (TUMS - DOSED IN MG ELEMENTAL CALCIUM) 500 MG chewable tablet Chew 4 tablets by mouth as needed for heartburn.    05/20/2019  . HYDROcodone-acetaminophen (NORCO/VICODIN) 5-325 MG tablet Take 1 tablet by mouth every 6 (six) hours as needed for moderate pain or severe pain. 6 tablet 0   . ibuprofen (ADVIL) 800 MG tablet Take 800 mg by mouth every 8 (eight) hours as needed for moderate pain.     Marland Kitchen omeprazole (PRILOSEC) 20 MG capsule Take 20 mg by mouth daily as needed for heartburn.     . ranitidine (ZANTAC) 300 MG tablet Take 0.5 tablets (150 mg total) by mouth at bedtime.       Review of Systems  HENT: Negative.   Respiratory: Negative.   Gastrointestinal: Negative for constipation, diarrhea  and vomiting.  Genitourinary: Positive for vaginal bleeding. Negative for urgency.  Musculoskeletal: Negative.   Neurological: Negative.    Physical Exam   Blood pressure 131/63, pulse 83, temperature 98.3 F (36.8 C), resp. rate 16, weight 46.3 kg, last menstrual period 03/29/2019, SpO2 100 %.  Physical Exam  Constitutional: She is oriented to person, place, and time. She appears well-developed.  HENT:  Head: Normocephalic.  Eyes: Pupils are equal, round, and reactive to light.  Respiratory: Effort normal.  GI: Soft.  Genitourinary:    Genitourinary Comments: NEFG; no blood in the vagina, thick mucousy, discharge, cervix is long, closed and thick.    Musculoskeletal:        General: Normal range of motion.     Cervical back: Normal range of motion.  Neurological: She is alert and oriented to person, place,  and time.  Skin: Skin is warm and dry.    MAU Course  Procedures  MDM -full ectopic work up performed -cbc is normal  -Beta HCG is 63,000  -US transvaginal US shows Patrick with cardiac activity; HR is 76.  -wet prep negative, GC pending.  -blood type AB  Assessment and Plan   1. Viable pregnancy in first trimester   2. Abdominal pain affecting pregnancy   -explained to patient that her US shows SIUP at 5 weeks 5 days and heart rate of 76; reviewed that this is low for a FHR and she should return to MAU if any changes in pain or bleeding.  -Keep follow-up appt and [place] at [place] (already scheduled) -All questions answered; patient stable for discharge.   Mervyn Skeeters Lydiann Bonifas 05/20/2019, 6:16 PM

## 2019-05-21 LAB — GC/CHLAMYDIA PROBE AMP (~~LOC~~) NOT AT ARMC
Chlamydia: NEGATIVE
Comment: NEGATIVE
Comment: NORMAL
Neisseria Gonorrhea: NEGATIVE

## 2019-06-16 LAB — OB RESULTS CONSOLE RUBELLA ANTIBODY, IGM: Rubella: IMMUNE

## 2019-06-16 LAB — OB RESULTS CONSOLE HEPATITIS B SURFACE ANTIGEN: Hepatitis B Surface Ag: NEGATIVE

## 2019-06-16 LAB — OB RESULTS CONSOLE HIV ANTIBODY (ROUTINE TESTING): HIV: NONREACTIVE

## 2019-06-18 LAB — OB RESULTS CONSOLE GC/CHLAMYDIA: Gonorrhea: NEGATIVE

## 2019-09-01 ENCOUNTER — Other Ambulatory Visit: Payer: Self-pay | Admitting: Obstetrics and Gynecology

## 2019-09-01 DIAGNOSIS — Z3A21 21 weeks gestation of pregnancy: Secondary | ICD-10-CM

## 2019-09-02 ENCOUNTER — Ambulatory Visit: Payer: 59 | Admitting: *Deleted

## 2019-09-02 ENCOUNTER — Other Ambulatory Visit: Payer: Self-pay

## 2019-09-02 ENCOUNTER — Other Ambulatory Visit: Payer: Self-pay | Admitting: *Deleted

## 2019-09-02 ENCOUNTER — Ambulatory Visit: Payer: 59 | Attending: Obstetrics and Gynecology

## 2019-09-02 VITALS — BP 99/55 | HR 76

## 2019-09-02 DIAGNOSIS — O359XX0 Maternal care for (suspected) fetal abnormality and damage, unspecified, not applicable or unspecified: Secondary | ICD-10-CM

## 2019-09-02 DIAGNOSIS — O365920 Maternal care for other known or suspected poor fetal growth, second trimester, not applicable or unspecified: Secondary | ICD-10-CM

## 2019-09-02 DIAGNOSIS — Z3A20 20 weeks gestation of pregnancy: Secondary | ICD-10-CM

## 2019-09-02 DIAGNOSIS — Z3492 Encounter for supervision of normal pregnancy, unspecified, second trimester: Secondary | ICD-10-CM

## 2019-09-02 DIAGNOSIS — Z363 Encounter for antenatal screening for malformations: Secondary | ICD-10-CM | POA: Diagnosis not present

## 2019-09-03 ENCOUNTER — Other Ambulatory Visit: Payer: Self-pay

## 2019-10-01 ENCOUNTER — Ambulatory Visit: Payer: 59

## 2019-10-08 ENCOUNTER — Ambulatory Visit: Payer: 59 | Admitting: *Deleted

## 2019-10-08 ENCOUNTER — Ambulatory Visit: Payer: 59 | Attending: Obstetrics

## 2019-10-08 ENCOUNTER — Other Ambulatory Visit: Payer: Self-pay

## 2019-10-08 ENCOUNTER — Encounter: Payer: Self-pay | Admitting: *Deleted

## 2019-10-08 VITALS — BP 99/57 | HR 92

## 2019-10-08 DIAGNOSIS — O283 Abnormal ultrasonic finding on antenatal screening of mother: Secondary | ICD-10-CM

## 2019-10-08 DIAGNOSIS — Z3492 Encounter for supervision of normal pregnancy, unspecified, second trimester: Secondary | ICD-10-CM | POA: Diagnosis present

## 2019-10-08 DIAGNOSIS — O34219 Maternal care for unspecified type scar from previous cesarean delivery: Secondary | ICD-10-CM | POA: Diagnosis not present

## 2019-10-08 DIAGNOSIS — O365920 Maternal care for other known or suspected poor fetal growth, second trimester, not applicable or unspecified: Secondary | ICD-10-CM | POA: Diagnosis present

## 2019-10-08 DIAGNOSIS — O358XX0 Maternal care for other (suspected) fetal abnormality and damage, not applicable or unspecified: Secondary | ICD-10-CM | POA: Diagnosis not present

## 2019-10-08 DIAGNOSIS — O359XX0 Maternal care for (suspected) fetal abnormality and damage, unspecified, not applicable or unspecified: Secondary | ICD-10-CM

## 2019-10-08 DIAGNOSIS — Z3A25 25 weeks gestation of pregnancy: Secondary | ICD-10-CM

## 2019-10-08 DIAGNOSIS — Z363 Encounter for antenatal screening for malformations: Secondary | ICD-10-CM | POA: Diagnosis not present

## 2019-12-17 LAB — OB RESULTS CONSOLE GBS: GBS: NEGATIVE

## 2020-01-07 ENCOUNTER — Inpatient Hospital Stay (HOSPITAL_COMMUNITY)
Admission: AD | Admit: 2020-01-07 | Discharge: 2020-01-09 | DRG: 807 | Disposition: A | Discharge status: Home / Self Care | Payer: 59 | Attending: Obstetrics & Gynecology | Admitting: Obstetrics & Gynecology

## 2020-01-07 ENCOUNTER — Inpatient Hospital Stay (HOSPITAL_COMMUNITY): Payer: 59 | Admitting: Anesthesiology

## 2020-01-07 ENCOUNTER — Encounter (HOSPITAL_COMMUNITY): Payer: Self-pay | Admitting: Obstetrics & Gynecology

## 2020-01-07 DIAGNOSIS — Z20822 Contact with and (suspected) exposure to covid-19: Secondary | ICD-10-CM | POA: Diagnosis present

## 2020-01-07 DIAGNOSIS — O34219 Maternal care for unspecified type scar from previous cesarean delivery: Principal | ICD-10-CM | POA: Diagnosis present

## 2020-01-07 DIAGNOSIS — Z885 Allergy status to narcotic agent status: Secondary | ICD-10-CM

## 2020-01-07 DIAGNOSIS — O9962 Diseases of the digestive system complicating childbirth: Secondary | ICD-10-CM | POA: Diagnosis present

## 2020-01-07 DIAGNOSIS — Z3A38 38 weeks gestation of pregnancy: Secondary | ICD-10-CM

## 2020-01-07 DIAGNOSIS — K219 Gastro-esophageal reflux disease without esophagitis: Secondary | ICD-10-CM | POA: Diagnosis present

## 2020-01-07 DIAGNOSIS — O26893 Other specified pregnancy related conditions, third trimester: Secondary | ICD-10-CM | POA: Diagnosis present

## 2020-01-07 LAB — CBC
HCT: 37.5 % (ref 36.0–46.0)
Hemoglobin: 12.2 g/dL (ref 12.0–15.0)
MCH: 28.9 pg (ref 26.0–34.0)
MCHC: 32.5 g/dL (ref 30.0–36.0)
MCV: 88.9 fL (ref 80.0–100.0)
Platelets: 187 10*3/uL (ref 150–400)
RBC: 4.22 MIL/uL (ref 3.87–5.11)
RDW: 16.5 % — ABNORMAL HIGH (ref 11.5–15.5)
WBC: 10.6 10*3/uL — ABNORMAL HIGH (ref 4.0–10.5)
nRBC: 0 % (ref 0.0–0.2)

## 2020-01-07 LAB — TYPE AND SCREEN
ABO/RH(D): AB POS
Antibody Screen: NEGATIVE

## 2020-01-07 LAB — RESP PANEL BY RT-PCR (FLU A&B, COVID) ARPGX2
Influenza A by PCR: NEGATIVE
Influenza B by PCR: NEGATIVE
SARS Coronavirus 2 by RT PCR: NEGATIVE

## 2020-01-07 MED ORDER — OXYTOCIN-SODIUM CHLORIDE 30-0.9 UT/500ML-% IV SOLN
1.0000 m[IU]/min | INTRAVENOUS | Status: DC
  Administered 2020-01-07: 1 m[IU]/min via INTRAVENOUS
  Filled 2020-01-07: qty 500

## 2020-01-07 MED ORDER — ACETAMINOPHEN 500 MG PO TABS
1000.0000 mg | ORAL_TABLET | Freq: Four times a day (QID) | ORAL | Status: DC | PRN
  Administered 2020-01-07: 1000 mg via ORAL
  Filled 2020-01-07: qty 2

## 2020-01-07 MED ORDER — OXYTOCIN-SODIUM CHLORIDE 30-0.9 UT/500ML-% IV SOLN: 1.0000 m[IU]/min | INTRAVENOUS | Status: DC

## 2020-01-07 MED ORDER — DIPHENHYDRAMINE HCL 50 MG/ML IJ SOLN: 12.5000 mg | INTRAMUSCULAR | Status: DC | PRN

## 2020-01-07 MED ORDER — OXYTOCIN BOLUS FROM INFUSION
333.0000 mL | Freq: Once | INTRAVENOUS | Status: AC
  Administered 2020-01-07: 333 mL via INTRAVENOUS

## 2020-01-07 MED ORDER — LACTATED RINGERS IV SOLN: INTRAVENOUS | Status: DC

## 2020-01-07 MED ORDER — SODIUM CHLORIDE (PF) 0.9 % IJ SOLN
INTRAMUSCULAR | Status: DC | PRN
  Administered 2020-01-07: 10 mL/h via EPIDURAL

## 2020-01-07 MED ORDER — LACTATED RINGERS IV SOLN: 500.0000 mL | INTRAVENOUS | Status: DC | PRN

## 2020-01-07 MED ORDER — ONDANSETRON HCL 4 MG/2ML IJ SOLN
4.0000 mg | Freq: Four times a day (QID) | INTRAMUSCULAR | Status: DC | PRN
  Administered 2020-01-07: 4 mg via INTRAVENOUS
  Filled 2020-01-07: qty 2

## 2020-01-07 MED ORDER — EPHEDRINE 5 MG/ML INJ: 10.0000 mg | INTRAVENOUS | Status: DC | PRN

## 2020-01-07 MED ORDER — FENTANYL CITRATE (PF) 100 MCG/2ML IJ SOLN
50.0000 ug | INTRAMUSCULAR | Status: DC | PRN
  Administered 2020-01-07: 100 ug via INTRAVENOUS
  Filled 2020-01-07: qty 2

## 2020-01-07 MED ORDER — PHENYLEPHRINE 40 MCG/ML (10ML) SYRINGE FOR IV PUSH (FOR BLOOD PRESSURE SUPPORT)
80.0000 ug | PREFILLED_SYRINGE | INTRAVENOUS | Status: DC | PRN
  Administered 2020-01-07: 80 ug via INTRAVENOUS
  Filled 2020-01-07: qty 10

## 2020-01-07 MED ORDER — FENTANYL-BUPIVACAINE-NACL 0.5-0.125-0.9 MG/250ML-% EP SOLN
12.0000 mL/h | EPIDURAL | Status: DC | PRN
  Filled 2020-01-07: qty 250

## 2020-01-07 MED ORDER — SOD CITRATE-CITRIC ACID 500-334 MG/5ML PO SOLN
30.0000 mL | ORAL | Status: DC | PRN
  Administered 2020-01-07: 30 mL via ORAL
  Filled 2020-01-07: qty 15

## 2020-01-07 MED ORDER — LACTATED RINGERS IV SOLN: 500.0000 mL | Freq: Once | INTRAVENOUS | Status: DC

## 2020-01-07 MED ORDER — ACETAMINOPHEN 325 MG PO TABS: 650.0000 mg | ORAL_TABLET | ORAL | Status: DC | PRN

## 2020-01-07 MED ORDER — OXYTOCIN-SODIUM CHLORIDE 30-0.9 UT/500ML-% IV SOLN: 2.5000 [IU]/h | INTRAVENOUS | Status: DC

## 2020-01-07 MED ORDER — SODIUM CHLORIDE 0.9 % IV SOLN
3.0000 g | Freq: Four times a day (QID) | INTRAVENOUS | Status: DC
  Administered 2020-01-07: 3 g via INTRAVENOUS
  Filled 2020-01-07 (×2): qty 8
  Filled 2020-01-07: qty 3
  Filled 2020-01-07: qty 8

## 2020-01-07 MED ORDER — LIDOCAINE HCL (PF) 1 % IJ SOLN
INTRAMUSCULAR | Status: DC | PRN
  Administered 2020-01-07 (×2): 4 mL via EPIDURAL

## 2020-01-07 MED ORDER — LIDOCAINE HCL (PF) 1 % IJ SOLN: 30.0000 mL | INTRAMUSCULAR | Status: DC | PRN

## 2020-01-07 MED ORDER — PHENYLEPHRINE 40 MCG/ML (10ML) SYRINGE FOR IV PUSH (FOR BLOOD PRESSURE SUPPORT)
80.0000 ug | PREFILLED_SYRINGE | INTRAVENOUS | Status: DC | PRN
  Administered 2020-01-07: 80 ug via INTRAVENOUS

## 2020-01-07 MED ORDER — TERBUTALINE SULFATE 1 MG/ML IJ SOLN: 0.2500 mg | Freq: Once | INTRAMUSCULAR | Status: DC | PRN

## 2020-01-07 NOTE — Progress Notes (Addendum)
Labor Progress Note  Athanasia Stanwood, 34 y.o., [phone], with an IUP @ [redacted]w[redacted]d, presented for Normal spontaneous labor, TOLAC, h/o CS for failure to progress. Cxt started last night and have gotten stronger.   Subjective: Pt comfortable with epidural, been pushing for 1 hour, no movement or fetal descent noted, early decel noted with cxt, pt VSE is 10/100/+1, discussed. Pt has temp of 102., suspect chorio. Elevated FHT.  Verbal consent: obtained from patient.  Risks and benefits discussed in detail.  Risks include, but are not limited to the risks of anesthesia, bleeding, infection, damage to maternal tissues, fetal cephalhematoma.  There is also the risk of inability to effect vaginal delivery of the head, or shoulder dystocia that cannot be resolved by established maneuvers, leading to the need for emergency cesarean section.   Patient Active Problem List   Diagnosis Date Noted  . Normal labor and delivery 01/07/2020  . SAB (spontaneous abortion)--11/10/18 11/18/2018  . Retained products of conception after miscarriage 11/18/2018  . S/P primary low transverse C-section 03/08/2016  . H/O scoliosis 03/08/2016   Objective: BP (!) 112/56 Comment: pt has shakes will recheck, pt asymptomatic   Pulse (!) 112   Temp (!) 102.2 F (39 C) (Axillary)   Resp 16   Ht 4\' 11"  (1.499 m)   Wt 56.7 kg   LMP 03/29/2019   SpO2 99%   BMI 25.25 kg/m  I/O last 3 completed shifts: In: 480 [P.O.:480] Out: -  Total I/O In: 125.3 [I.V.:125.3] Out: 925 [Urine:925] NST: FHR baseline 155 bpm, Variability: moderate, Accelerations:present, Decelerations:  Present, early decels= Cat 2/Reactive CTX:  irregular, every 2-3 minutes, lasting 60-110 Uterus gravid, soft non tender, moderate to palpate with contractions.  SVE:  Dilation: 10 Effacement (%): 100 Station: Plus 1 Exam by:: Lenox Ponds  Pitocin at (OFF, was on 1) mUn/min  Assessment:  Ellaree Gear, 34 y.o., G3P1011, with an IUP @ [redacted]w[redacted]d, presented for  Normal spontaneous labor, TOLAC, h/o CS for failure to progress. Cxt started last night and have gotten stronger. Pt progressing active labor comfortable with epidural. AROM.   Patient Active Problem List   Diagnosis Date Noted  . Normal labor and delivery 01/07/2020  . SAB (spontaneous abortion)--11/10/18 11/18/2018  . Retained products of conception after miscarriage 11/18/2018  . S/P primary low transverse C-section 03/08/2016  . H/O scoliosis 03/08/2016   NICHD: Category 2  Fluid bolus, and pushing in different position attempted.   Membranes:  AROM, bloody/clear @ 9509 @ 11/26 x 6hrs, maternal temp 102 with stents of fetal tachy, s/s of infection  Induction:    Pitocin - was on 1 and turned off due to tachysytole.    Pain management:               IV pain management: x once fentanyl @1254              Epidural placement:  at 1407 on 11/26  GBS Negative   Plan: Continue labor plan Suspected chorio: tylenol 1000mg , start Unasyn 3gm Q6H Consult with Dr Alwyn Pea about vacuum assist.  Continuous monitoring Rest Frequent position changes to facilitate fetal rotation and descent. Continue pushing  Anticipate vaginal delivery.   Noralyn Pick, NP-C, CNM, MSN 01/07/2020. 9:33 PM

## 2020-01-07 NOTE — Anesthesia Procedure Notes (Signed)
Epidural Patient location during procedure: OB Start time: 01/07/2020 2:29 PM End time: 01/07/2020 2:31 PM  Staffing Anesthesiologist: Audry Pili, MD Performed: anesthesiologist   Preanesthetic Checklist Completed: patient identified, IV checked, risks and benefits discussed, monitors and equipment checked, pre-op evaluation and timeout performed  Epidural Patient position: sitting Prep: DuraPrep Patient monitoring: continuous pulse ox and blood pressure Approach: midline Location: L2-L3 Injection technique: LOR saline  Needle:  Needle type: Tuohy  Needle gauge: 17 G Needle length: 9 cm Needle insertion depth: 3 cm Catheter size: 19 Gauge Catheter at skin depth: 8 cm Test dose: negative and Other (1% lidocaine)  Assessment Events: blood not aspirated  Additional Notes Patient identified. Risks including, but not limited to, bleeding, infection, nerve damage, paralysis, inadequate analgesia, blood pressure changes, nausea, vomiting, allergic reaction, postpartum back pain, itching, and headache were discussed. Patient expressed understanding and wished to proceed. Sterile prep and drape, including hand hygiene, mask, and sterile gloves were used. The patient was positioned and the spine was prepped. The skin was anesthetized with lidocaine. No paraesthesia or other complication noted. The patient did not experience any signs of intravascular injection such as tinnitus or metallic taste in mouth, nor signs of intrathecal spread such as rapid motor block. Please see nursing notes for vital signs. The patient tolerated the procedure well.   Renold Don, MDReason for block:procedure for pain

## 2020-01-07 NOTE — H&P (Signed)
Jessica Wood is a 34 y.o. female, G3P1011, IUP at 38.6 weeks, presenting for Normal spontaneous labor, TOLAC, h/o CS for failure to progress. Cxt started last night and have gotten stronger. Pt endorse + Fm. Denies vaginal leakage. Denies vaginal bleeding. H/O scoliosis and dilation of fetal renal pelvis that has resolved. Allergy to oxycodone.   Patient Active Problem List   Diagnosis Date Noted   Normal labor and delivery 01/07/2020   SAB (spontaneous abortion)--11/10/18 11/18/2018   Retained products of conception after miscarriage 11/18/2018   S/P primary low transverse C-section 03/08/2016   H/O scoliosis 03/08/2016    Medications Prior to Admission  Medication Sig Dispense Refill Last Dose   Ascorbic Acid (VITAMIN C) 1000 MG tablet Take 1,000 mg by mouth daily.    Past Month at Unknown time   calcium carbonate (TUMS - DOSED IN MG ELEMENTAL CALCIUM) 500 MG chewable tablet Chew 4 tablets by mouth as needed for heartburn.    Past Month at Unknown time   omeprazole (PRILOSEC) 20 MG capsule Take 20 mg by mouth daily as needed for heartburn.    Past Month at Unknown time   Prenatal Vit-Fe Fumarate-FA (PRENATAL VITAMIN PO) Take 1 tablet by mouth daily.    01/06/2020 at Unknown time   progesterone 200 MG SUPP Place 200 mg vaginally at bedtime. (Patient not taking: Reported on 09/02/2019)       Past Medical History:  Diagnosis Date   GERD (gastroesophageal reflux disease)    Scoliosis      No current facility-administered medications on file prior to encounter.   Current Outpatient Medications on File Prior to Encounter  Medication Sig Dispense Refill   Ascorbic Acid (VITAMIN C) 1000 MG tablet Take 1,000 mg by mouth daily.      calcium carbonate (TUMS - DOSED IN MG ELEMENTAL CALCIUM) 500 MG chewable tablet Chew 4 tablets by mouth as needed for heartburn.      omeprazole (PRILOSEC) 20 MG capsule Take 20 mg by mouth daily as needed for heartburn.      Prenatal Vit-Fe  Fumarate-FA (PRENATAL VITAMIN PO) Take 1 tablet by mouth daily.      progesterone 200 MG SUPP Place 200 mg vaginally at bedtime. (Patient not taking: Reported on 09/02/2019)       Allergies  Allergen Reactions   Oxycodone Rash    History of present pregnancy: Pt Info/Preference:  Screening/Consents:  Labs:   EDD: Estimated Date of Delivery: 01/15/20  Establised: Patient's last menstrual period was 03/29/2019.  Anatomy Scan: Date: 20.6 wg Placenta Location: posterior Genetic Screen: Panoroma:LR AFP:  First Tri: Quad:  Office: [facility]            First PNV: 11.6 wg Blood Type  AB+  Language: english Last PNV: 37.3 wg Rhogam  N/A  Flu Vaccine:  declined   Antibody  Neg  TDaP vaccine UTD   GTT: Early: 5.0 Third Trimester: 94  Feeding Plan: breast BTL: no Rubella:  Immune  Contraception: ??? VBAC: Desires consent signed RPR:   NR  Circumcision: ???   HBsAg:  NEG  Pediatrician:  ???   HIV:   NR  Prenatal Classes: no Additional [place]: At 34 week resolved ptyalectasis, wnl growth GBS:  Negative(For PCN allergy, check sensitivities)       Chlamydia: Neg    MFM Referral/Consult:  GC: Neg  Support Person: partner   PAP: 2020-normal  Pain Management: epidural Neonatologist Referral:  Hgb Electrophoresis:  AA  Birth Plan: [facility]  Hgb NOB: 13.8    28W: 9.8   OB History    Gravida  3   Para  1   Term  1   Preterm      AB  1   Living  1     SAB  0   TAB      Ectopic      Multiple  0   Live Births  1          Past Medical History:  Diagnosis Date   GERD (gastroesophageal reflux disease)    Scoliosis    Past Surgical History:  Procedure Laterality Date   CESAREAN SECTION N/A 03/06/2016   Procedure: CESAREAN SECTION;  Surgeon: Crawford Givens, MD;  Location: [place];  Service: Obstetrics;  Laterality: N/A;   DILATION AND EVACUATION N/A 11/19/2018   Procedure: SUCTION DILATATION AND EVACUATION;  Surgeon: Everett Graff, MD;  Location: [place];  Service: Gynecology;   Laterality: N/A;   WISDOM TOOTH EXTRACTION  2016   Family History: family history is not on file. Social History:  reports that she has never smoked. She has never used smokeless tobacco. She reports that she does not drink alcohol and does not use drugs.   Prenatal Transfer Tool  Maternal Diabetes: No Genetic Screening: Normal Maternal Ultrasounds/Referrals: Normal Fetal Ultrasounds or other Referrals:  None Maternal Substance Abuse:  No Significant Maternal Medications:  None Significant Maternal Lab Results: Group B Strep negative  ROS:  Review of Systems  Constitutional: Negative.   HENT: Negative.   Eyes: Negative.   Respiratory: Negative.   Cardiovascular: Negative.   Gastrointestinal: Positive for abdominal pain.  Genitourinary: Negative.   Musculoskeletal: Negative.   Skin: Negative.   Neurological: Negative.   Endo/Heme/Allergies: Negative.   Psychiatric/Behavioral: Negative.      Physical Exam: BP (!) 107/51    Pulse (!) 110    Resp 18    Ht 4\' 11"  (1.499 m)    Wt 56.7 kg    LMP 03/29/2019    BMI 25.25 kg/m   Physical Exam Vitals and nursing note reviewed. Exam conducted with a chaperone present.  Constitutional:      Appearance: Normal appearance.  HENT:     Head: Normocephalic and atraumatic.     Nose: Nose normal.     Mouth/Throat:     Mouth: Mucous membranes are moist.  Eyes:     Pupils: Pupils are equal, round, and reactive to light.  Cardiovascular:     Rate and Rhythm: Normal rate and regular rhythm.     Pulses: Normal pulses.     Heart sounds: Normal heart sounds.  Pulmonary:     Effort: Pulmonary effort is normal.     Breath sounds: Normal breath sounds.  Abdominal:     Palpations: Abdomen is soft.  Genitourinary:    Comments: Uterus gravida with adequate vaginal inlet.  Musculoskeletal:        General: Normal range of motion.     Cervical back: Normal range of motion and neck supple.  Skin:    General: Skin is warm.     Capillary  Refill: Capillary refill takes less than 2 seconds.  Neurological:     General: No focal deficit present.     Mental Status: She is alert.  Psychiatric:        Mood and Affect: Mood normal.      NST: FHR baseline 135 bpm, Variability: moderate, Accelerations:present, Decelerations:  Absent= Cat 1/Reactive UC:  regular, every 6 minutes SVE:   Dilation: 6 Effacement (%): 90 Station: 0, vertex verified by fetal sutures.  Leopold's: Position vertex, EFW 6lbs via leopold's.   Labs: Results for orders placed or performed during the hospital encounter of 01/07/20 (from the past 24 hour(s))  Resp Panel by RT-PCR (Flu A&B, Covid) Nasopharyngeal Swab     Status: None   Collection Time: 01/07/20 11:42 AM   Specimen: Nasopharyngeal Swab; Nasopharyngeal(NP) swabs in vial transport medium  Result Value Ref Range   SARS Coronavirus 2 by RT PCR NEGATIVE NEGATIVE   Influenza A by PCR NEGATIVE NEGATIVE   Influenza B by PCR NEGATIVE NEGATIVE    Imaging:  No results found.  MAU Course: Orders Placed This Encounter  Procedures   Resp Panel by RT-PCR (Flu A&B, Covid) Nasopharyngeal Swab   CBC   RPR   Diet clear liquid Room service appropriate? Yes; Fluid consistency: Thin   May use local infiltration of 1% lidocaine plain to produce a skin wheal prior to IV insertion   Notify in-house Anesthesia team of nausea and vomiting greater than 5 hours   Assess for signs/symptoms of PIH/preeclampsia   RN to place order for: CBC if one has not been drawn in the past 6 hours for all patients with hypertensive disease, pre-eclampsia, eclampsia, thrombocytopenia or previous PLTC<150,000.   Identify to Anesthesia if patient plans to have postpartum tubal ligation; do not remove epidural without discussion with Anesthesiologist   Vital signs following Epidural Placement, re-bolus or re-dose monitor patient's BP and oxygen saturation every 5 minutes for 30 minutes   RN to remain at bedside  continuously for 30 minutes post epidural placement, post re-bolus / re-dose   Notify Anesthesia if the patient becomes short of breath or complains of heaviness in chest, chest pain, and/or unrelieved pain   Notify Anesthesia prior to discontinuing epidural infusion   If the patient plans to have a postpartum tubal ligation, cover epidural catheter with non-injectable port cap and tape to patient's shoulder   Notify Anesthesia MD Immediately post-epidural for:   Vitals signs per unit policy   Notify Physician   Fetal monitoring per unit policy   Activity as tolerated   Cervical Exam   Measure blood pressure post delivery every 15 min x 1 hour then every 30 min x 1 hour   Fundal check post delivery every 15 min x 1 hour then every 30 min x 1 hour   If Rapid HIV test positive or known HIV positive: initiate AZT orders   May in and out cath x 2 for inability to void   Insert foley catheter   Discontinue foley prior to vaginal delivery   Initiate Carrier Fluid Protocol   Initiate Oral Care Protocol   Order Rapid HIV per protocol if no results on chart   Patient may have epidural placement upon request   Full code   Type and screen [place]   Epidural catheter may be discontinued following delivery if hemodynamically stable with no active bleeding and no post-partum tubal planned. Contact Anesthesiology for any complications.   Insert and maintain IV Line   Admit to Inpatient (patient's expected length of stay will be greater than 2 midnights or inpatient only procedure)   Meds ordered this encounter  Medications   ePHEDrine injection 10 mg   PHENYLephrine 40 mcg/ml in normal saline Adult IV Push Syringe (For Blood Pressure Support)   lactated ringers infusion 500 mL   fentaNYL 2  mcg/mL w/ bupivacaine 0.125% in NS 250 mL epidural infusion ([facility])   diphenhydrAMINE (BENADRYL) injection 12.5 mg   ePHEDrine injection 10 mg    PHENYLephrine 40 mcg/ml in normal saline Adult IV Push Syringe (For Blood Pressure Support)   lactated ringers infusion   oxytocin (PITOCIN) IV BOLUS FROM BAG   oxytocin (PITOCIN) IV infusion 30 units in NS 500 mL - Premix   lactated ringers infusion 500-1,000 mL   acetaminophen (TYLENOL) tablet 650 mg   ondansetron (ZOFRAN) injection 4 mg   sodium citrate-citric acid (ORACIT) solution 30 mL   lidocaine (PF) (XYLOCAINE) 1 % injection 30 mL    Assessment/Plan: Jessica Wood is a 34 y.o. female, G3P1011, IUP at 38.6 weeks, presenting for Normal spontaneous labor, TOLAC, h/o CS for failure to progress. Cxt started last night and have gotten stronger. Pt endorse + Fm. Denies vaginal leakage. Denies vaginal bleeding. H/O scoliosis and dilation of fetal renal pelvis that has resolved. Allergy to oxycodone.   FWB: Cat 1 Fetal Tracing.   Plan: Admit to [place] per consult with DR Alwyn Pea Routine [facility] orders Pain med/epidural prn Anticipate TOLAC progression and succession   [facility] NP-C, CNM, MSN 01/07/2020, 12:30 PM

## 2020-01-07 NOTE — MAU Note (Signed)
Pt stated ctx startes last night got stronger this morning about 2-4 min apart. deneis any vag bleeding or leaking. V-back. Good fetal movement felt. 3cm at last appointment.

## 2020-01-07 NOTE — Progress Notes (Signed)
Labor Progress Note  Jessica Wood, 34 y.o., [phone], with an IUP @ [redacted]w[redacted]d, presented for Normal spontaneous labor, TOLAC, h/o CS for failure to progress. Cxt started last night and have gotten stronger.   Subjective: Pt resting well in NAD, resting and comfortable post epidural. Pt comfortable, discussed AROM with IUPC and pitocin, pt declined pitocin and IUPC for now, desires to see what occurs with AROM alone, pt verbalized consent for AROM.  Patient Active Problem List   Diagnosis Date Noted  . Normal labor and delivery 01/07/2020  . SAB (spontaneous abortion)--11/10/18 11/18/2018  . Retained products of conception after miscarriage 11/18/2018  . S/P primary low transverse C-section 03/08/2016  . H/O scoliosis 03/08/2016   Objective: BP (!) 100/39 (BP Location: Left Arm)   Pulse (!) 104   Temp 98.3 F (36.8 C) (Oral)   Resp 17   Ht 4\' 11"  (1.499 m)   Wt 56.7 kg   LMP 03/29/2019   SpO2 100%   BMI 25.25 kg/m  No intake/output data recorded. No intake/output data recorded. NST: FHR baseline 130 bpm, Variability: moderate, Accelerations:present, Decelerations:  Absent= Cat 1/Reactive CTX:  irregular, every 2-6 minutes, lasting 100-160 Uterus gravid, soft non tender, moderate to palpate with contractions.  SVE:  Dilation: 6 Effacement (%): 70 Station: -1 Exam by:: [employer] CNM Pitocin at (Declined) mUn/min AROM, clear tolerated well.   Assessment:  Jessica Wood, 34 y.o., [phone], with an IUP @ [redacted]w[redacted]d, presented for Normal spontaneous labor, TOLAC, h/o CS for failure to progress. Cxt started last night and have gotten stronger. Pt progressing active labor comfortable with epidural. AROM.   Patient Active Problem List   Diagnosis Date Noted  . Normal labor and delivery 01/07/2020  . SAB (spontaneous abortion)--11/10/18 11/18/2018  . Retained products of conception after miscarriage 11/18/2018  . S/P primary low transverse C-section 03/08/2016  . H/O scoliosis 03/08/2016    NICHD: Category 1  Membranes:  AROM, bloody/clear @ 1410 @ 11/26 x 0hrs, no s/s of infection  Induction:    Pitocin - Discussed R/B/A pt declined for now.   Pain management:               IV pain management: x once fentanyl @1254              Epidural placement:  at 1407 on 11/26  GBS Negative   Plan: Continue labor plan Continuous monitoring Rest Frequent position changes to facilitate fetal rotation and descent. Will reassess with cervical exam at 1600 or earlier if necessary Plan for pitocin and IUPC per protocol when pt consent and if cxt space again/or if no cervical change noted.  Anticipate labor progression and vaginal delivery.   Noralyn Pick, NP-C, CNM, MSN 01/07/2020. 3:27 PM

## 2020-01-07 NOTE — Anesthesia Preprocedure Evaluation (Signed)
Anesthesia Evaluation  Patient identified by MRN, date of birth, ID band Patient awake    Reviewed: Allergy & Precautions, NPO status , Patient's Chart, lab work & pertinent test results  History of Anesthesia Complications Negative for: history of anesthetic complications  Airway Mallampati: II   Neck ROM: Full    Dental   Pulmonary neg pulmonary ROS,    Pulmonary exam normal        Cardiovascular negative cardio ROS Normal cardiovascular exam     Neuro/Psych negative neurological ROS  negative psych ROS   GI/Hepatic Neg liver ROS, GERD  Medicated and Controlled,  Endo/Other  negative endocrine ROS  Renal/GU negative Renal ROS     Musculoskeletal  Scoliosis    Abdominal   Peds  Hematology negative hematology ROS (+)   Anesthesia Other Findings Covid test negative   Reproductive/Obstetrics (+) Pregnancy                             Anesthesia Physical Anesthesia Plan  ASA: II  Anesthesia Plan: Epidural   Post-op Pain Management:    Induction:   PONV Risk Score and Plan: 2 and Treatment may vary due to age or medical condition  Airway Management Planned: Natural Airway  Additional Equipment: None  Intra-op Plan:   Post-operative Plan:   Informed Consent: I have reviewed the patients History and Physical, chart, labs and discussed the procedure including the risks, benefits and alternatives for the proposed anesthesia with the patient or authorized representative who has indicated his/her understanding and acceptance.       Plan Discussed with: Anesthesiologist  Anesthesia Plan Comments: (Labs reviewed. Platelets acceptable, patient not taking any blood thinning medications. Per RN, FHR tracing reported to be stable enough for sitting procedure. Risks and benefits discussed with patient, including PDPH, backache, epidural hematoma, failed epidural, blood pressure  changes, allergic reaction, and nerve injury. Patient expressed understanding and wished to proceed.)        Anesthesia Quick Evaluation

## 2020-01-08 ENCOUNTER — Encounter (HOSPITAL_COMMUNITY): Payer: Self-pay | Admitting: Obstetrics & Gynecology

## 2020-01-08 DIAGNOSIS — O34219 Maternal care for unspecified type scar from previous cesarean delivery: Secondary | ICD-10-CM | POA: Diagnosis not present

## 2020-01-08 LAB — RPR: RPR Ser Ql: NONREACTIVE

## 2020-01-08 LAB — CBC
HCT: 30.6 % — ABNORMAL LOW (ref 36.0–46.0)
Hemoglobin: 10.1 g/dL — ABNORMAL LOW (ref 12.0–15.0)
MCH: 29.4 pg (ref 26.0–34.0)
MCHC: 33 g/dL (ref 30.0–36.0)
MCV: 89.2 fL (ref 80.0–100.0)
Platelets: 164 10*3/uL (ref 150–400)
RBC: 3.43 MIL/uL — ABNORMAL LOW (ref 3.87–5.11)
RDW: 16.5 % — ABNORMAL HIGH (ref 11.5–15.5)
WBC: 11.5 10*3/uL — ABNORMAL HIGH (ref 4.0–10.5)
nRBC: 0 % (ref 0.0–0.2)

## 2020-01-08 MED ORDER — IBUPROFEN 600 MG PO TABS
600.0000 mg | ORAL_TABLET | Freq: Four times a day (QID) | ORAL | Status: DC
  Administered 2020-01-08 – 2020-01-09 (×6): 600 mg via ORAL
  Filled 2020-01-08 (×6): qty 1

## 2020-01-08 MED ORDER — DIBUCAINE (PERIANAL) 1 % EX OINT: 1.0000 "application " | TOPICAL_OINTMENT | CUTANEOUS | Status: DC | PRN

## 2020-01-08 MED ORDER — ACETAMINOPHEN 325 MG PO TABS: 650.0000 mg | ORAL_TABLET | ORAL | Status: DC | PRN

## 2020-01-08 MED ORDER — ONDANSETRON HCL 4 MG PO TABS: 4.0000 mg | ORAL_TABLET | ORAL | Status: DC | PRN

## 2020-01-08 MED ORDER — TETANUS-DIPHTH-ACELL PERTUSSIS 5-2.5-18.5 LF-MCG/0.5 IM SUSY: 0.5000 mL | PREFILLED_SYRINGE | Freq: Once | INTRAMUSCULAR | Status: DC

## 2020-01-08 MED ORDER — SIMETHICONE 80 MG PO CHEW: 80.0000 mg | CHEWABLE_TABLET | ORAL | Status: DC | PRN

## 2020-01-08 MED ORDER — PRENATAL MULTIVITAMIN CH
1.0000 | ORAL_TABLET | Freq: Every day | ORAL | Status: DC
  Administered 2020-01-08 – 2020-01-09 (×2): 1 via ORAL
  Filled 2020-01-08 (×2): qty 1

## 2020-01-08 MED ORDER — WITCH HAZEL-GLYCERIN EX PADS
1.0000 "application " | MEDICATED_PAD | CUTANEOUS | Status: DC | PRN
  Administered 2020-01-08: 1 via TOPICAL

## 2020-01-08 MED ORDER — BENZOCAINE-MENTHOL 20-0.5 % EX AERO
1.0000 "application " | INHALATION_SPRAY | CUTANEOUS | Status: DC | PRN
  Administered 2020-01-08: 1 via TOPICAL
  Filled 2020-01-08: qty 56

## 2020-01-08 MED ORDER — COCONUT OIL OIL
1.0000 "application " | TOPICAL_OIL | Status: DC | PRN
  Administered 2020-01-08: 1 via TOPICAL

## 2020-01-08 MED ORDER — DIPHENHYDRAMINE HCL 25 MG PO CAPS: 25.0000 mg | ORAL_CAPSULE | Freq: Four times a day (QID) | ORAL | Status: DC | PRN

## 2020-01-08 MED ORDER — LACTATED RINGERS IV SOLN: INTRAVENOUS | Status: DC

## 2020-01-08 MED ORDER — ZOLPIDEM TARTRATE 5 MG PO TABS: 5.0000 mg | ORAL_TABLET | Freq: Every evening | ORAL | Status: DC | PRN

## 2020-01-08 MED ORDER — SENNOSIDES-DOCUSATE SODIUM 8.6-50 MG PO TABS
2.0000 | ORAL_TABLET | ORAL | Status: DC
  Administered 2020-01-08: 2 via ORAL
  Filled 2020-01-08: qty 2

## 2020-01-08 MED ORDER — ONDANSETRON HCL 4 MG/2ML IJ SOLN: 4.0000 mg | INTRAMUSCULAR | Status: DC | PRN

## 2020-01-08 NOTE — Lactation Note (Signed)
This note was copied from a baby's chart. Lactation Consultation Note  Patient Name: Jessica Wood [id number] Date: 01/08/2020  [name] now 25 hours old Born vaginal delivery with vacuum assist.   Mom had recently breastfed on arrival.This her second baby.  Hx of low supply with first baby. Mom  Continued to pump until close to 9 months providing roughly 4 oz breastmilk day.  Praised moms efforts.  Reports sore nipples.  Mom with small blisters and compression stripes on both nipples.  Infant still cuing.  Asked if we could try laid back breastfeeding.  Attempted to lay mom back.  Could not get her comfortable.  After a few minutes, NP came in to assess baby.  Asked mom to break the suction and mom broke suction and took her off breast to be assessed. Nipple slightly compressed. Urged mom to attempt to hand express and rub  Initiated pumping with mom. Used coconut oil with pumping.  Urged to feed on cue and 8-12 or more times day. Urged massage/hand expression and pumping with DEBP every 2-3 hours past breastfeeding.  Reviewed and gave [facility] breastfeeding handout.  Urged to call lactation as needed.  Asked pm LC to check on mom.    Maternal Data    Feeding    LATCH Score                   Interventions    Lactation Tools Discussed/Used     Consult Status      Martyn Timme Thompson Caul 01/08/2020, 5:33 PM

## 2020-01-08 NOTE — Anesthesia Postprocedure Evaluation (Signed)
Anesthesia Post Note  Patient: Lakechia Nay  Procedure(s) Performed: AN AD HOC LABOR EPIDURAL     Patient location during evaluation: Mother Baby Anesthesia Type: Epidural Level of consciousness: awake and alert Pain management: pain level controlled Vital Signs Assessment: post-procedure vital signs reviewed and stable Respiratory status: spontaneous breathing Cardiovascular status: stable Postop Assessment: no headache, adequate PO intake, no backache, patient able to bend at knees, able to ambulate, epidural receding and no apparent nausea or vomiting Anesthetic complications: no   No complications documented.  Last Vitals:  Vitals:   01/08/20 0230 01/08/20 0602  BP: 91/60 (!) 89/55  Pulse: 99 79  Resp: 16 18  Temp: 36.8 C 36.4 C  SpO2: 99% 98%    Last Pain:  Vitals:   01/08/20 0602  TempSrc: Oral  PainSc: 0-No pain   Pain Goal: Patients Stated Pain Goal: 0 (01/07/20 1810)                 Ailene Ards

## 2020-01-08 NOTE — Progress Notes (Signed)
Subjective: Postpartum Day # 1 : S/P VAVD due to pt presented on 11/26 for spontaneous active labor and was intact, proceeded with Augmentin of AROM, pitocin was on for 1 hour at 1 but d/c'ed due to natural cxt. Pt started to develop chorio during pushing, maternal temp of 1022, decreased with tylenol and x1 dose IV Unasyn, pt pushed for 3-3.5 hours, unable to create fetal decent at station +2, Dr Alwyn Pea VAVD newborn and tolerated well over a 2nd degree repaired with qbl of 622mls, hgb drop of 12.2-10.1, stable. Patient up ad lib, denies syncope or dizziness. Reports consuming regular diet without issues and denies N/V. Patient reports   bowel movement + passing flatus. Endorsees not able to void most likely due to swelling from pushing and vacuumed, foley placed, will d/c later today or tomorrow once swelling is decreased, ice packs to perineum. Reports bleeding is "lighter."  Patient is breast and bottle feeding and reports going well.  Desires NFP for postpartum contraception.  Pain is being appropriately managed with use of po meds.   2nd laceration Feeding:  Breast/Bottle Contraceptive plan:  NFP Baby Female  Objective: Vital signs in last 24 hours: Patient Vitals for the past 24 hrs:  BP Temp Temp src Pulse Resp SpO2  01/08/20 0958 (!) 88/52 98.1 F (36.7 C) Oral 77 16 99 %  01/08/20 0602 (!) 89/55 97.6 F (36.4 C) Oral 79 18 98 %  01/08/20 0230 91/60 98.2 F (36.8 C) Oral 99 16 99 %  01/08/20 0130 102/62 98.2 F (36.8 C) Oral 100 18 100 %  01/08/20 0045 (!) 102/54 -- -- 89 -- --  01/08/20 0031 91/64 -- -- 96 20 --  01/08/20 0030 -- -- -- -- 18 --  01/08/20 0015 (!) 121/59 -- -- 83 -- --  01/08/20 0000 104/61 -- -- 83 17 --  01/07/20 2345 (!) 108/50 98.4 F (36.9 C) Oral 85 16 --  01/07/20 2331 (!) 105/49 -- -- 79 -- --  01/07/20 2330 -- -- -- -- 17 --  01/07/20 2304 (!) 114/46 -- -- 93 -- --  01/07/20 2250 -- -- -- -- -- 96 %  01/07/20 2245 -- -- -- -- -- 97 %  01/07/20 2240 --  -- -- -- -- 98 %  01/07/20 2235 -- -- -- -- -- 96 %  01/07/20 2230 (!) 117/51 (!) 101.9 F (38.8 C) Axillary (!) 106 -- --  01/07/20 2210 -- -- -- -- -- 96 %  01/07/20 2203 (!) 108/50 -- -- (!) 103 18 --  01/07/20 2155 -- -- -- -- -- 95 %  01/07/20 2131 127/66 -- -- (!) 110 -- --  01/07/20 2100 (!) 110/95 -- -- (!) 113 20 --  01/07/20 2051 -- (!) 102.2 F (39 C) Axillary -- -- --  01/07/20 2041 (!) 112/56 -- -- (!) 112 -- --  01/07/20 2038 -- 99.9 F (37.7 C) Oral -- 16 --  01/07/20 2025 -- -- -- -- -- 99 %  01/07/20 2024 -- -- -- -- -- 99 %  01/07/20 2022 -- -- -- -- -- 99 %  01/07/20 2020 -- -- -- -- -- 99 %  01/07/20 2015 -- -- -- -- -- 100 %  01/07/20 2010 -- -- -- -- -- 99 %  01/07/20 2005 -- -- -- -- -- 98 %  01/07/20 2001 139/72 -- -- (!) 107 18 --  01/07/20 2000 -- -- -- -- -- 99 %  01/07/20 1955 -- -- -- -- --  98 %  01/07/20 1940 -- -- -- -- -- 99 %  01/07/20 1935 -- -- -- -- -- 99 %  01/07/20 1930 129/74 98.5 F (36.9 C) Oral (!) 101 17 100 %  01/07/20 1900 102/75 -- -- (!) 109 17 --  01/07/20 1830 (!) 99/46 -- -- 88 16 --  01/07/20 1801 (!) 94/45 -- -- 91 17 --  01/07/20 1731 (!) 105/59 -- -- 82 16 --  01/07/20 1700 (!) 102/57 -- -- 89 17 --  01/07/20 1630 (!) 99/54 -- -- 88 16 --  01/07/20 1617 (!) 91/46 -- -- 91 17 --  01/07/20 1600 (!) 80/36 -- -- 90 16 --  01/07/20 1530 (!) 101/47 -- -- 92 17 --  01/07/20 1502 (!) 100/39 -- -- (!) 104 -- --  01/07/20 1455 (!) 103/51 -- -- (!) 109 17 --  01/07/20 1450 115/63 -- -- 98 16 --  01/07/20 1445 (!) 90/44 -- -- (!) 119 17 --  01/07/20 1440 (!) 95/35 -- -- (!) 121 18 --  01/07/20 1436 (!) 76/58 -- -- (!) 103 16 --  01/07/20 1433 (!) 111/47 -- -- 99 17 --  01/07/20 1431 110/62 -- -- (!) 108 -- --  01/07/20 1400 (!) 107/54 -- -- 84 17 --  01/07/20 1331 (!) 94/52 -- -- 89 17 --  01/07/20 1223 (!) 102/47 -- -- 82 -- 100 %     Physical Exam:  General: alert, cooperative, appears stated age and no  distress Mood/Affect: Fatigued Lungs: clear to auscultation, no wheezes, rales or rhonchi, symmetric air entry.  Heart: normal rate, regular rhythm, normal S1, S2, no murmurs, rubs, clicks or gallops. Breast: breasts appear normal, no suspicious masses, no skin or nipple changes or axillary nodes. Abdomen:  + bowel sounds, soft, non-tender GU: perineum approximate, healing well. No signs of external hematomas.  Uterine Fundus: firm Lochia: appropriate Skin: Warm, Dry. DVT Evaluation: No evidence of DVT seen on physical exam. Negative Homan's sign. No cords or calf tenderness. No significant calf/ankle edema.  CBC Latest Ref Rng & Units 01/08/2020 01/07/2020 05/20/2019  WBC 4.0 - 10.5 K/uL 11.5(H) 10.6(H) 8.8  Hemoglobin 12.0 - 15.0 g/dL 10.1(L) 12.2 13.0  Hematocrit 36 - 46 % 30.6(L) 37.5 39.4  Platelets 150 - 400 K/uL 164 187 267    Results for orders placed or performed during the hospital encounter of 01/07/20 (from the past 24 hour(s))  Resp Panel by RT-PCR (Flu A&B, Covid) Nasopharyngeal Swab     Status: None   Collection Time: 01/07/20 11:42 AM   Specimen: Nasopharyngeal Swab; Nasopharyngeal(NP) swabs in vial transport medium  Result Value Ref Range   SARS Coronavirus 2 by RT PCR NEGATIVE NEGATIVE   Influenza A by PCR NEGATIVE NEGATIVE   Influenza B by PCR NEGATIVE NEGATIVE  CBC     Status: Abnormal   Collection Time: 01/07/20 11:45 AM  Result Value Ref Range   WBC 10.6 (H) 4.0 - 10.5 K/uL   RBC 4.22 3.87 - 5.11 MIL/uL   Hemoglobin 12.2 12.0 - 15.0 g/dL   HCT 37.5 36 - 46 %   MCV 88.9 80.0 - 100.0 fL   MCH 28.9 26.0 - 34.0 pg   MCHC 32.5 30.0 - 36.0 g/dL   RDW 16.5 (H) 11.5 - 15.5 %   Platelets 187 150 - 400 K/uL   nRBC 0.0 0.0 - 0.2 %  Type and screen [place]     Status: None   Collection  Time: 01/07/20 11:45 AM  Result Value Ref Range   ABO/RH(D) AB POS    Antibody Screen NEG    Sample Expiration      01/10/2020,2359 Performed at [facility], [place] [address], [place], [place] [address]   RPR     Status: None   Collection Time: 01/07/20 11:45 AM  Result Value Ref Range   RPR Ser Ql NON REACTIVE NON REACTIVE  CBC     Status: Abnormal   Collection Time: 01/08/20  4:25 AM  Result Value Ref Range   WBC 11.5 (H) 4.0 - 10.5 K/uL   RBC 3.43 (L) 3.87 - 5.11 MIL/uL   Hemoglobin 10.1 (L) 12.0 - 15.0 g/dL   HCT 30.6 (L) 36 - 46 %   MCV 89.2 80.0 - 100.0 fL   MCH 29.4 26.0 - 34.0 pg   MCHC 33.0 30.0 - 36.0 g/dL   RDW 16.5 (H) 11.5 - 15.5 %   Platelets 164 150 - 400 K/uL   nRBC 0.0 0.0 - 0.2 %     CBG (last 3)  No results for input(s): GLUCAP in the last 72 hours.   I/O last 3 completed shifts: In: 605.3 [P.O.:480; I.V.:125.3] Out: 3034 [Urine:2425; Blood:609]   Assessment Postpartum Day # 1 : S/P VAVD due to pt presented on 11/26 for spontaneous active labor and was intact, proceeded with Augmentin of AROM, pitocin was on for 1 hour at 1 but d/c'ed due to natural cxt. Pt started to develop chorio during pushing, maternal temp of 1022, decreased with tylenol and x1 dose IV Unasyn, pt pushed for 3-3.5 hours, unable to create fetal decent at station +2, Dr Alwyn Pea VAVD newborn and tolerated well over a 2nd degree repaired with qbl of 651mls, hgb drop of 12.2-10.1, stable. Pt stable. -1 involution. Breast and bottle feeding. Hemodynamically stable.   Plan: Continue other mgmt as ordered VTE prophylactics: Early ambulated as tolerates.  Pain control: Motrin/Tylenol PRN Education given regarding options for contraception, including barrier methods, injectable contraception, IUD placement, oral contraceptives.  Plan for discharge tomorrow, Breastfeeding, Lactation consult and Contraception NFP   Dr. Delora Fuel to be updated on patient status  [facility] NP-C, CNM 01/08/2020, 11:41 AM

## 2020-01-09 ENCOUNTER — Other Ambulatory Visit: Payer: Self-pay

## 2020-01-09 ENCOUNTER — Encounter (HOSPITAL_COMMUNITY): Payer: Self-pay | Admitting: Obstetrics & Gynecology

## 2020-01-09 MED ORDER — ACETAMINOPHEN 325 MG PO TABS: 650.0000 mg | ORAL_TABLET | ORAL | Status: AC | PRN

## 2020-01-09 NOTE — Lactation Note (Addendum)
This note was copied from a baby's chart. Lactation Consultation Note  Patient Name: Jessica Wood Date: 01/09/2020 Reason for consult: Follow-up assessment;Early term 37-38.6wks P2, 28 hour ETI female infant with -2% weigh loss. Per mom, she is concern about her supply and her nipples are sore, mom wants to supplement infant with donor breast milk. LC informed RN of mom's choice of breastfeeding and using donor breast milk. Per mom, infant is breastfeeding longer now  approximately 30 to 45 minutes per feeding, LC praised mom on her breastfeeding efforts. Mom only used the DEBP once today. LC assisted mom with latching infant on her left breast, applying coconut oil prior to latching infant,  LC gave mom pillows to help support infant, infant latched with depth, per mom, she feels tug with latch infant was still BF after 30 minutes when LC left the room.  Mom's current plan: 1. Mom will continue to latch infant according to cues, 8 to 12+ times within 24 hours. 2. Apply coconut oil to help with breast soreness, mom knows to break latch if she feels pain and not a tug when infant is latched. 3. Mom will offer any EBM first and then supplement with donor breast milk after each feeding based on infant's age/ hours of life. 4. Mom will start using the DEBP every 3 hours for 15 minutes on initial setting to help with stimulating breast and establish milk supply.  27. Mom knows to call RN or LC to help assist with latching infant at the breast if needed.   Maternal Data    Feeding Feeding Type: Breast Fed  LATCH Score Latch: Grasps breast easily, tongue down, lips flanged, rhythmical sucking.  Audible Swallowing: Spontaneous and intermittent  Type of Nipple: Everted at rest and after stimulation  Comfort (Breast/Nipple): Filling, red/small blisters or bruises, mild/mod discomfort  Hold (Positioning): Assistance needed to correctly position infant at breast and maintain  latch.  LATCH Score: 8  Interventions Interventions: Skin to skin;Assisted with latch;Adjust position;Support pillows;Position options;Coconut oil;DEBP  Lactation Tools Discussed/Used Tools: Shells;Pump;Coconut oil Shell Type: Sore Breast pump type: Double-Electric Breast Pump   Consult Status Consult Status: Follow-up Date: 01/09/20 Follow-up type: In-patient    [name] 01/09/2020, 3:12 AM

## 2020-01-09 NOTE — Discharge Instructions (Signed)

## 2020-01-09 NOTE — Discharge Summary (Signed)
Postpartum Discharge Summary       Patient Name: Jessica Wood DOB: November 28, 1985 MRN: [id number]  Date of admission: 01/07/2020 Delivery date:01/07/2020  Delivering provider: Sanjuana Kava  Date of discharge: 01/09/2020  Admitting diagnosis: Normal labor and delivery [O80] Intrauterine pregnancy: [redacted]w[redacted]d    Secondary diagnosis:  Active Problems:   Normal labor and delivery   Vacuum-assisted vaginal delivery   Normal postpartum course   Vaginal birth after cesarean delivery  Additional problems: None    Discharge diagnosis: VBAC                                              Post partum procedures:None Augmentation: Pitocin Complications: None  Hospital course: Onset of Labor With Vaginal Delivery     vacuum assisted 34y.o. yo [id number] admitted in Active Labor on 01/07/2020. Patient had an uncomplicated labor course as follows:  Membrane Rupture Time/Date: 3:19 PM ,01/07/2020   Delivery Method:VBAC, Vacuum Assisted  Episiotomy: None  Lacerations:  2nd degree  Patient had an uncomplicated postpartum course.  She is ambulating, tolerating a regular diet, passing flatus, and urinating well. Patient is discharged home in stable condition on 01/09/20.  Newborn Data: Birth date:01/07/2020  Birth time:11:00 PM  Gender:Female  Living status:Living  Apgars:8 ,9  Weight:3170 g   Magnesium Sulfate received: No BMZ received: No Rhophylac:N/A MMR:N/A T-DaP:UTD Flu: Declined Transfusion:No  Physical exam  Vitals:   01/08/20 0958 01/08/20 1345 01/08/20 2240 01/09/20 0530  BP: (!) 88/52 99/62 (!) 103/59 (!) 94/54  Pulse: 77 72 69 72  Resp: '16 16 16 17  ' Temp: 98.1 F (36.7 C) 98.2 F (36.8 C) 98 F (36.7 C) 97.8 F (36.6 C)  TempSrc: Oral Oral Oral Oral  SpO2: 99% 99% 100% 98%  Weight:      Height:       General: alert, cooperative and no distress Lochia: appropriate Uterine Fundus: firm Incision: perineum healing DVT Evaluation: No evidence of DVT seen  on physical exam. Labs: Lab Results  Component Value Date   WBC 11.5 (H) 01/08/2020   HGB 10.1 (L) 01/08/2020   HCT 30.6 (L) 01/08/2020   MCV 89.2 01/08/2020   PLT 164 01/08/2020   No flowsheet data found. Edinburgh Score: Edinburgh Postnatal Depression Scale Screening Tool 01/08/2020  I have been able to laugh and see the funny side of things. 0  I have looked forward with enjoyment to things. 0  I have blamed myself unnecessarily when things went wrong. 2  I have been anxious or worried for no good reason. 0  I have felt scared or panicky for no good reason. 0  Things have been getting on top of me. 1  I have been so unhappy that I have had difficulty sleeping. 0  I have felt sad or miserable. 1  I have been so unhappy that I have been crying. 1  The thought of harming myself has occurred to me. 0  Edinburgh Postnatal Depression Scale Total 5     After visit meds:  PNV, Ibuprofen Tylenol   Discharge home in stable condition Infant Feeding: Breast Infant Disposition:home with mother Discharge instruction: per After Visit Summary and Postpartum booklet. Activity: Advance as tolerated. Pelvic rest for 6 weeks.  Diet: routine diet Future Appointments:No future appointments. Follow up Visit:  [facility] Follow up in 6 week(s).   Specialty: Obstetrics and Gynecology Contact information: [address] [place] [address] [phone]               Please schedule this patient for a In person postpartum visit in 6 weeks with the following provider: Any provider. Additional Postpartum F/U:N/A  Delivery mode:  VBAC, Vacuum Assisted  Anticipated Birth Control:  Unsure   01/09/2020 Starla Link, CNM

## 2020-01-09 NOTE — Lactation Note (Signed)
This note was copied from a baby's chart. Lactation Consultation Note  Patient Name: Jessica Wood [id number]'Z Date: 01/09/2020 Reason for consult: Follow-up assessment  P2 mother whose infant is now 52 hours old.  This is an ETI at 38+6 weeks.  Mother has a history of low milk supply and pumped and bottle fed for 9 months with first child (almost 34 years old).  Mother is choosing to supplement with donor breast milk.  Baby was asleep when I arrived. Discussed breast feeding basics with mother.  She stated her nipples are sensitive and that baby cluster fed from 2300-0400 last night.  Upon observation, her nipples are everted and intact.  No trauma noted.  Encouraged to use EBM/coconut oil for comfort to nipples.  Offered to assist with latching, however, mother wanted to have her breakfast first.  Offered to return after breakfast to assist as needed.  Discussed increasing volume supplementation today.  Asked mother to continue pumping for 15 minutes after breast feeding and to feed back any EBM she obtains to baby.  Mother has a DEBP for home use.  No support person present at this time.  RN updated.   Maternal Data    Feeding Feeding Type: Donor Breast Milk  LATCH Score                   Interventions    Lactation Tools Discussed/Used     Consult Status Consult Status: Complete Date: 01/09/20 Follow-up type: Call as needed    Phuong Moffatt R Jonnatan Hanners 01/09/2020, 11:33 AM

## 2020-01-09 NOTE — Lactation Note (Signed)
This note was copied from a baby's chart. Lactation Consultation Note  Patient Name: Girl Jalene Demo [id number] Date: 01/09/2020 Reason for consult: Follow-up assessment   LC Follow Up Visit:  Mother requested assistance as discussed from previous visit.  Baby not showing any feeding cues when I arrived.  Suggested mother remove clothing and I assisted in showing mother how to awaken her sleepy baby.  Positioned pillows appropriately and assisted baby to latch easily to the left breast in the football hold.  Baby remained sleepy and needed almost constant stimulation to suck effectively.  However, with stimulation she responded nicely and rhythmic sucking noted.  An occasional swallow was heard.  Encouraged mother to be proactive in making sure baby is feeding well at the breast.  Baby was feeding for 10 minutes prior to my departure.    Mother will call for further assistance.    Maternal Data    Feeding Feeding Type: Breast Fed  LATCH Score Latch: Repeated attempts needed to sustain latch, nipple held in mouth throughout feeding, stimulation needed to elicit sucking reflex.  Audible Swallowing: A few with stimulation  Type of Nipple: Everted at rest and after stimulation  Comfort (Breast/Nipple): Soft / non-tender  Hold (Positioning): Assistance needed to correctly position infant at breast and maintain latch.  LATCH Score: 7  Interventions Interventions: Breast feeding basics reviewed;Assisted with latch;Skin to skin;Breast massage;Hand express;Breast compression;Adjust position;Position options;Support pillows  Lactation Tools Discussed/Used     Consult Status Consult Status: Follow-up Date: 01/10/20 Follow-up type: In-patient    Little Ishikawa 01/09/2020, 1:38 PM

## 2020-01-10 ENCOUNTER — Ambulatory Visit: Payer: Self-pay

## 2020-01-10 NOTE — Lactation Note (Signed)
This note was copied from a baby's chart. Lactation Consultation Note  Patient Name: Jessica Wood [id number] Date: 01/10/2020 Reason for consult: Follow-up assessment   P2 mother whose infant is now 81 hours old.  This is a LPTI at 38+6 weeks.  Mother has a history of low milk supply and pumped and bottle fed for 9 months with her first child (almost 34 years old).  Mother is supplementing with donor breast milk.  Baby was asleep when I arrived.  Discussed breast feeding basics with mother and reviewed milk collection and storage.  Engorgement prevention/treatment reviewed.  Mother had some questions which I answered to her satisfaction.    Mother stated that breast feeding has improved since our visit together yesterday.  Reminded mother to always feed STS and to keep baby actively sucking during feedings.  Baby responds well to gentle stimulation.  Asked mother to increase supplementation volumes to 30 mls or more as baby desires, burping well with nipple feedings.    Pediatrician in room during my visit.  She will be obtaining a serum bilirubin level this morning and mother is hoping to be discharged after this time.  She has a morning appointment tomorrow at the pediatrician's office.  No support person present at this time.  RN updated.   Maternal Data    Feeding Feeding Type: Breast Fed Nipple Type: Slow - flow  LATCH Score Latch: Grasps breast easily, tongue down, lips flanged, rhythmical sucking.  Audible Swallowing: Spontaneous and intermittent  Type of Nipple: Everted at rest and after stimulation  Comfort (Breast/Nipple): Soft / non-tender  Hold (Positioning): Assistance needed to correctly position infant at breast and maintain latch.  LATCH Score: 9  Interventions Interventions: Breast feeding basics reviewed;Assisted with latch;Skin to skin;Adjust position;Position options;Support pillows  Lactation Tools Discussed/Used     Consult Status Consult Status:  Complete Date: 01/10/20 Follow-up type: Call as needed    Jessica Wood 01/10/2020, 9:46 AM

## 2020-01-11 LAB — SURGICAL PATHOLOGY

## 2021-01-03 IMAGING — US US OB COMP LESS 14 WK
2 series · 15 of 28 positions shown · non-contrast
Comparison: None.

CLINICAL DATA: Vaginal bleeding

EXAM:
OBSTETRIC <14 WK US AND TRANSVAGINAL OB US
TECHNIQUE: Both transabdominal and transvaginal ultrasound examinations were
performed for complete evaluation of the gestation as well as the
maternal uterus, adnexal regions, and pelvic cul-de-sac.
Transvaginal technique was performed to assess early pregnancy.

[Series 1: us ob comp less 14 wk · 4 of 14 slices shown (1 of 2)]
[im 1/14]
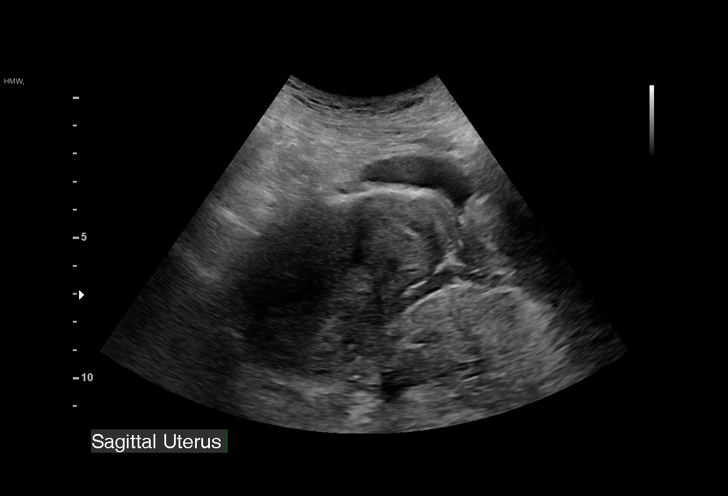
[im 5/14]
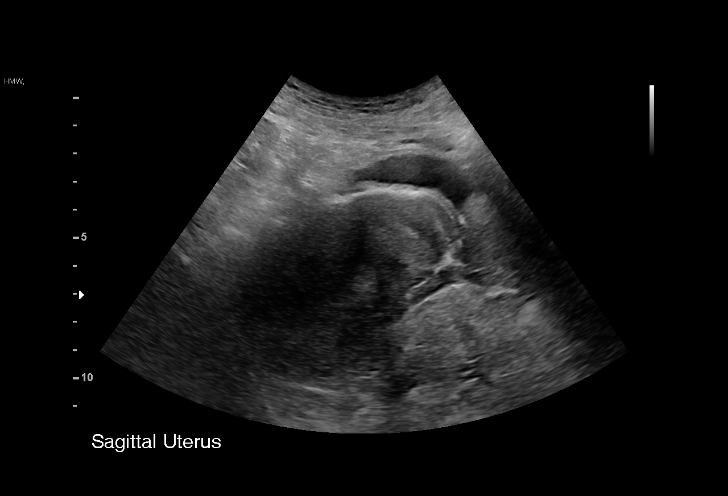
[im 9/14]
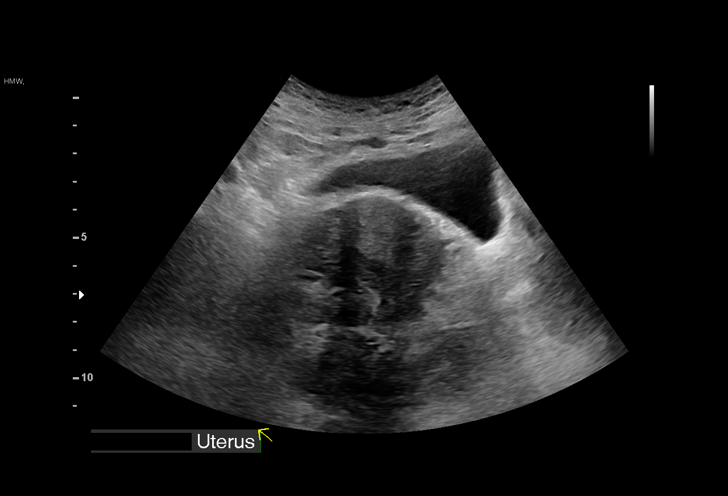
[im 14/14]
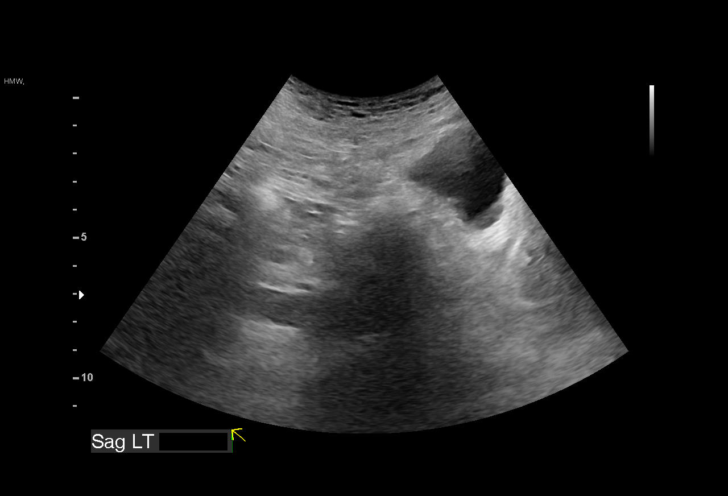

[Series 2: us ob comp less 14 wk · 11 of 39 slices shown (2 of 2)]
[im 2/39]
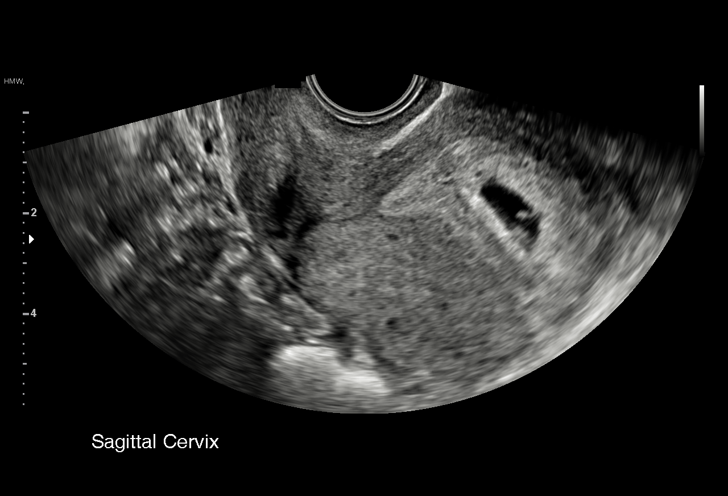
[im 6/39]
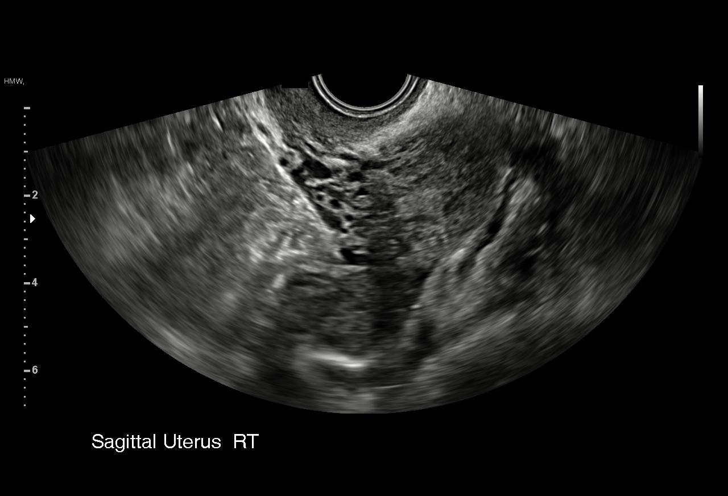
[im 10/39]
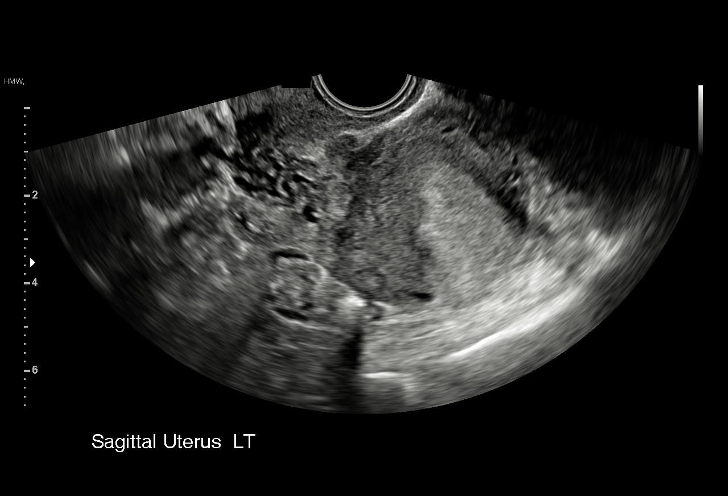
[im 14/39]
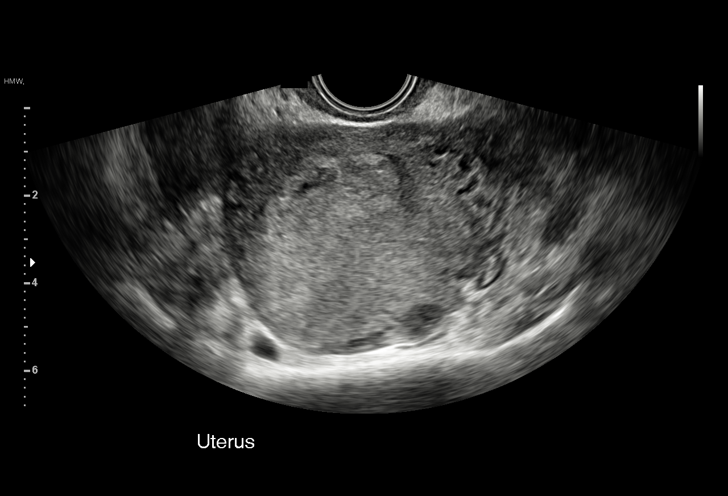
[im 16/39]
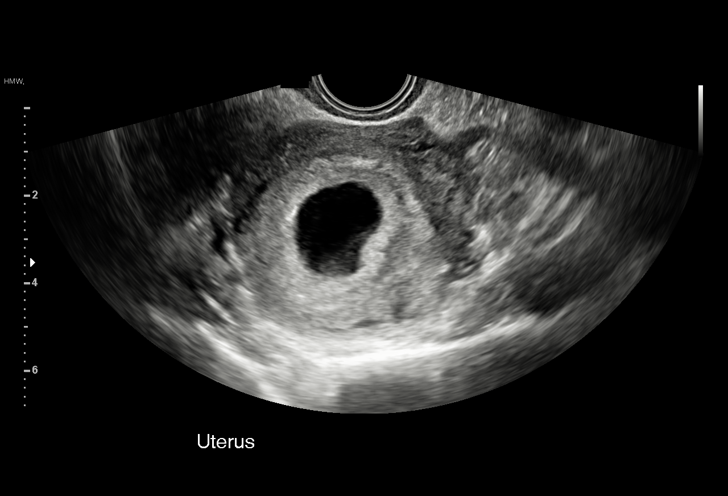
[im 20/39]
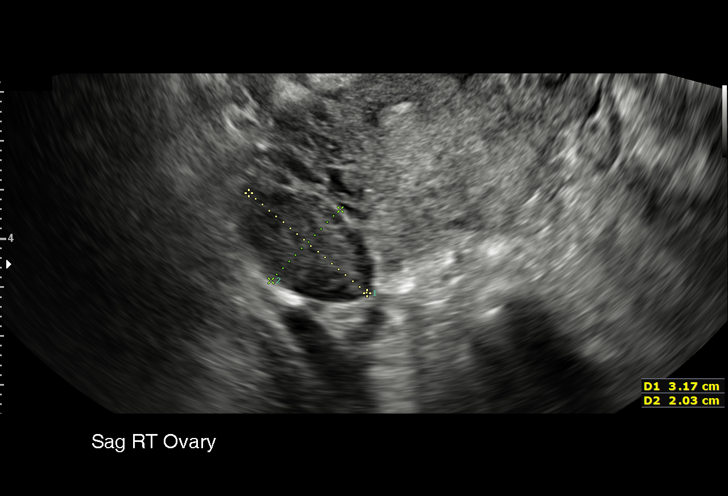
[im 23/39]
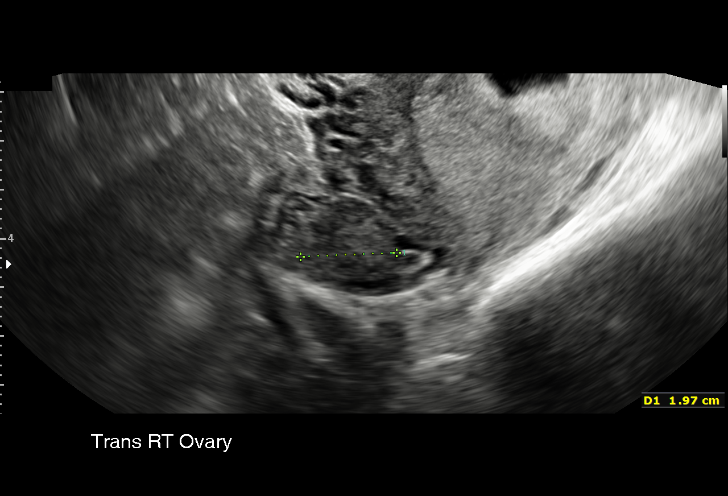
[im 27/39]
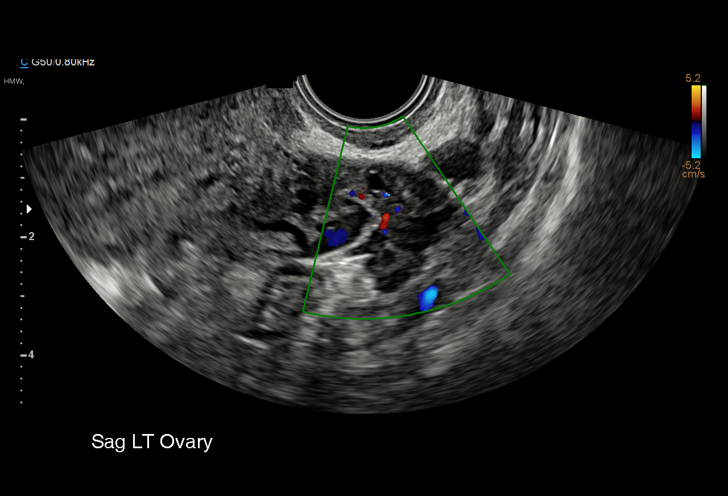
[im 31/39]
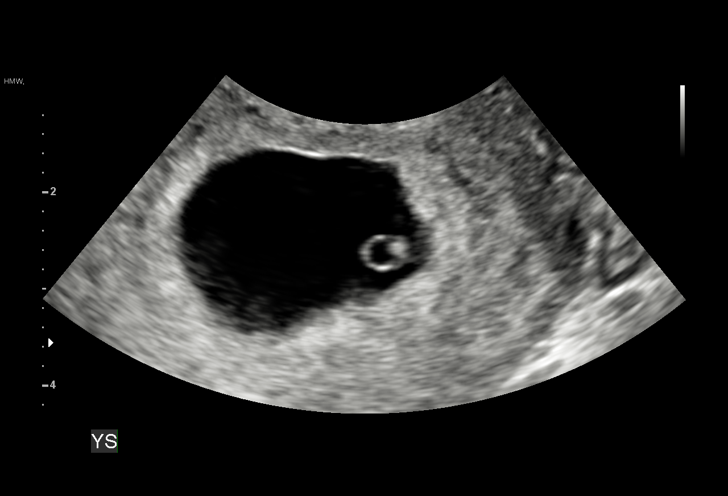
[im 35/39]
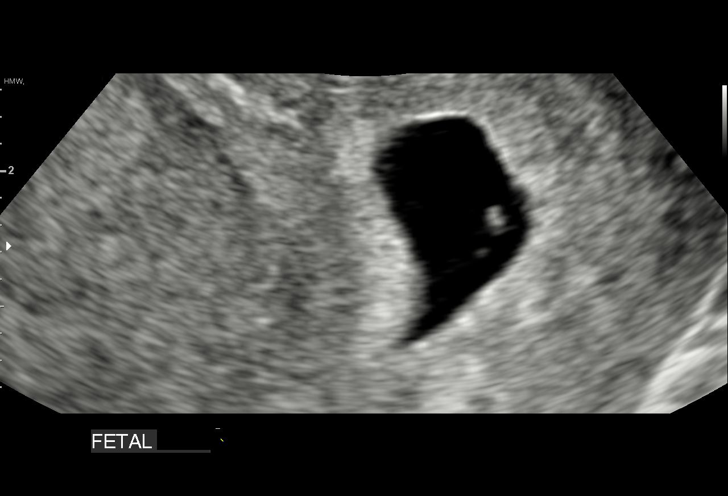
[im 39/39]
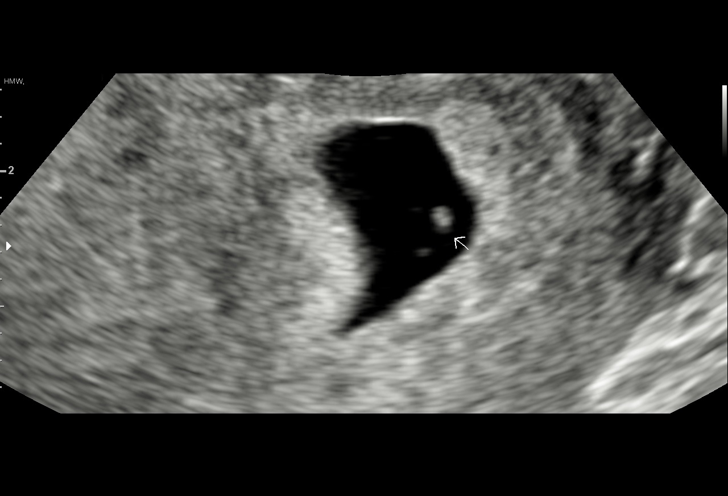

[15 of 28 positions shown; findings below may reference images not displayed]

FINDINGS: Intrauterine gestational sac: Single

Yolk sac:  Visualized

Embryo:  Visualized

Cardiac Activity: Visualized

Heart Rate: 76 bpm

MSD:   mm    w     d

CRL:  2.21 mm   5 w   5 d                  US EDC: 01/15/2020

Subchorionic hemorrhage:  None visualized

Maternal uterus/adnexae: No adnexal mass or free fluid.
IMPRESSION: Five week 5 day intrauterine pregnancy. Fetal bradycardia at 76
beats per minute. Recommend follow-up ultrasound in 10-14 days to
ensure expected progression.

## 2021-04-18 IMAGING — US US MFM OB DETAIL+14 WK
1 series · 12 of 28 positions shown · non-contrast
Comparison: none

[Series 1: us mfm ob detail+14 wk · 12 of 85 slices shown]
[im 4/85]
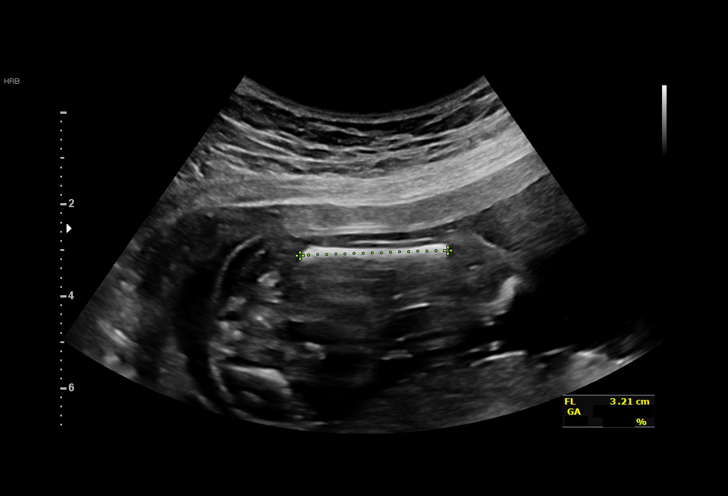
[im 10/85]
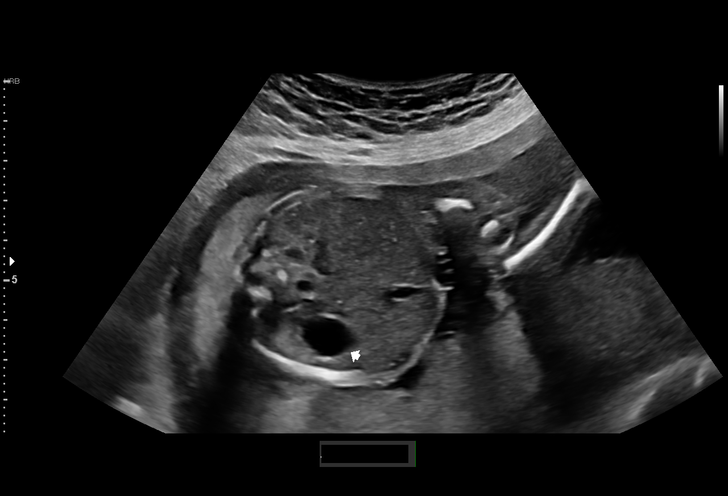
[im 16/85]
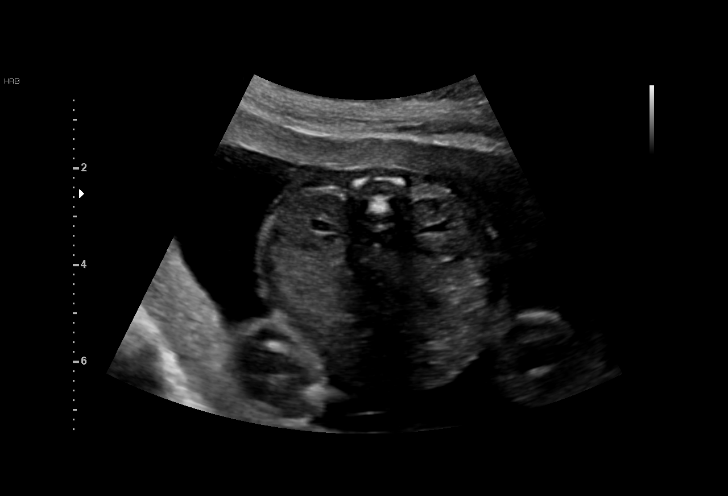
[im 25/85]
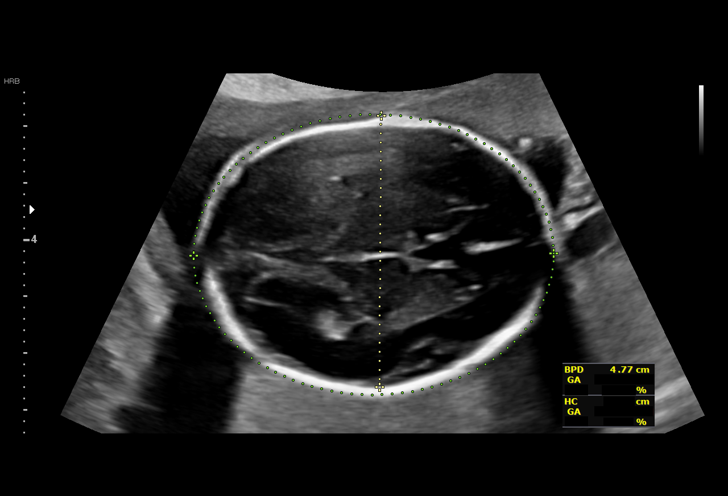
[im 32/85]
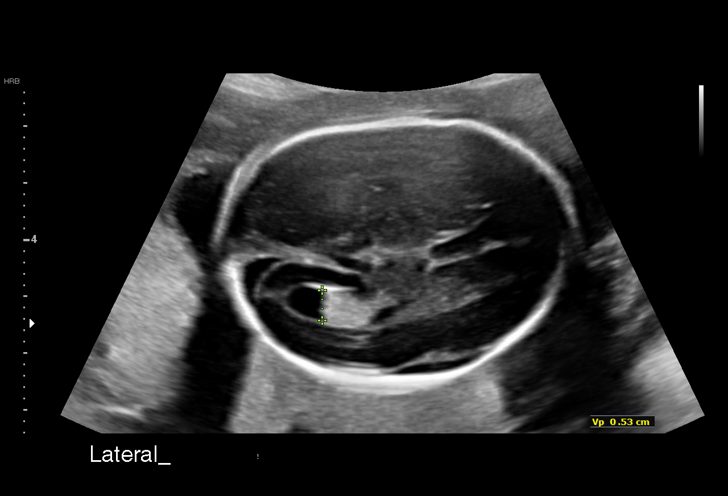
[im 38/85]
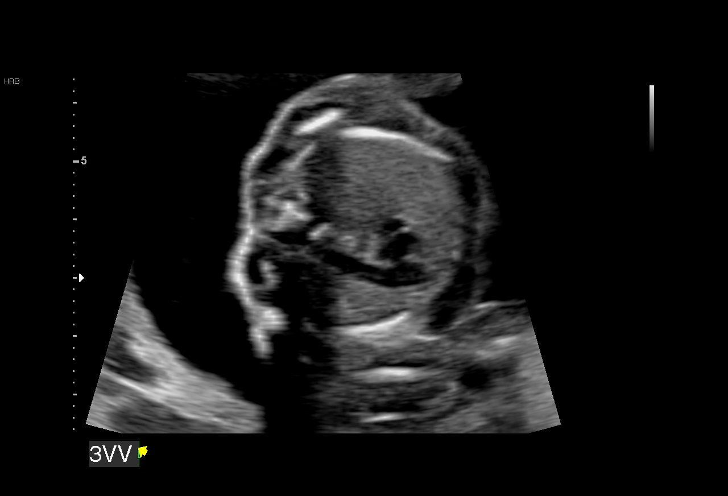
[im 47/85]
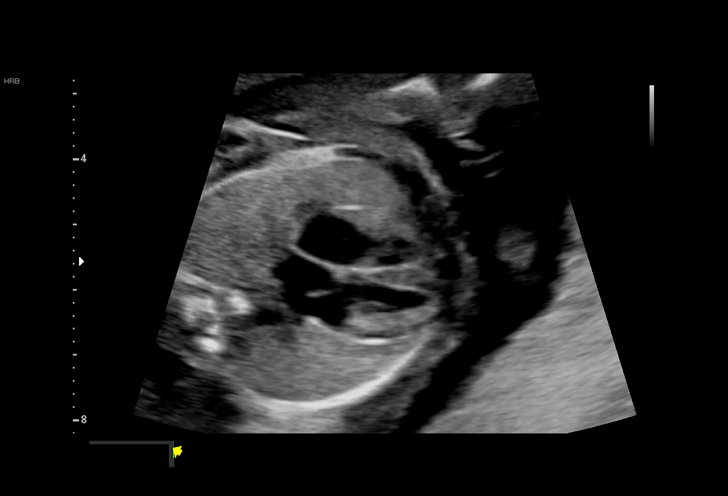
[im 53/85]
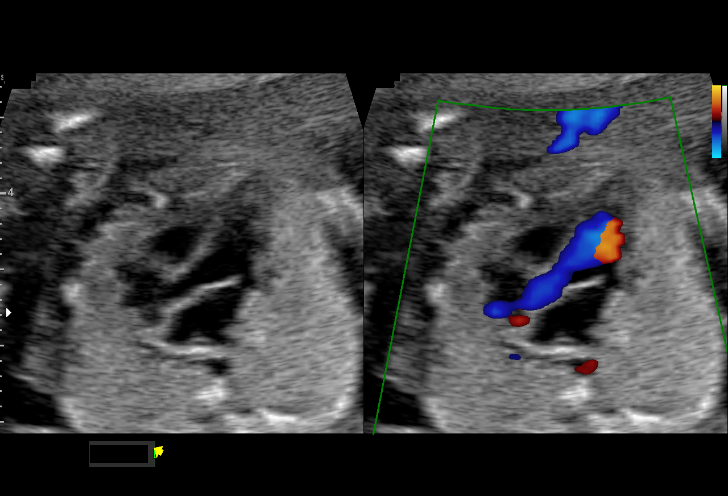
[im 60/85]
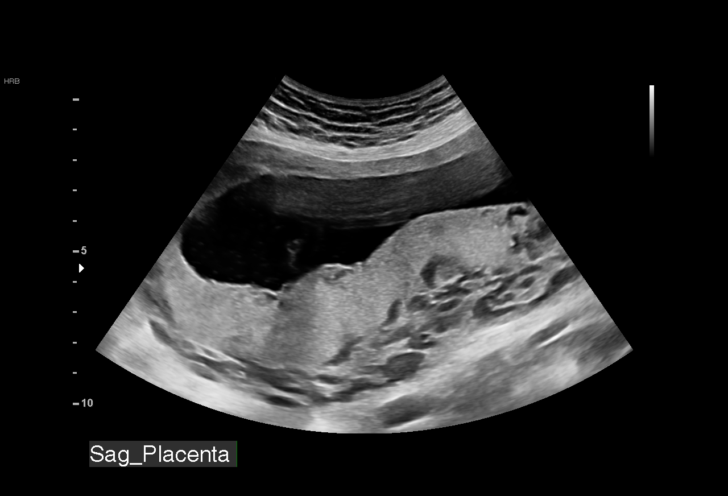
[im 69/85]
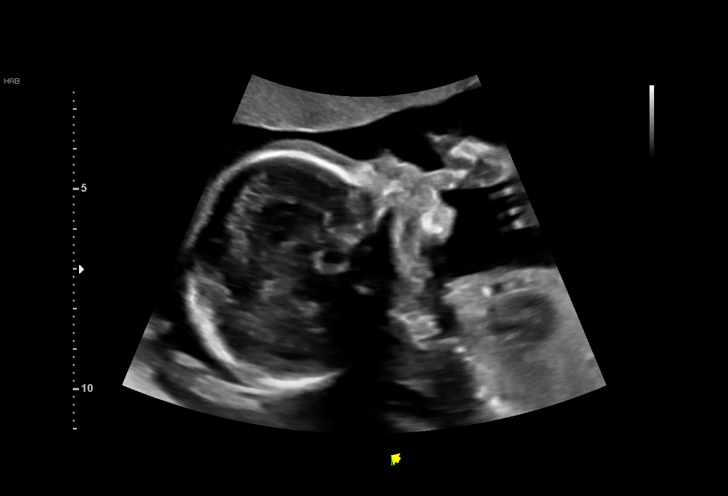
[im 75/85]
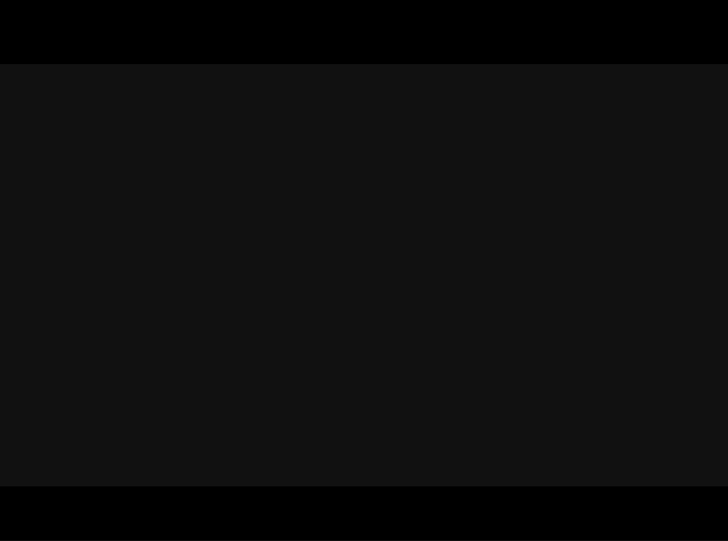
[im 81/85]
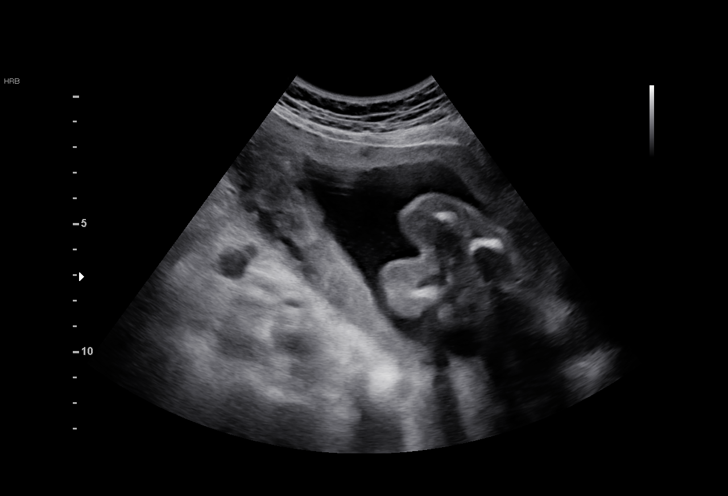

[12 of 28 positions shown; findings below may reference images not displayed]

Obstetrics &
                                                            Gynecology
                                                            [address]
                                                            [place].

Indications

 Other known or suspected (SGA, UTD,
 CPCs in office)
 Encounter for antenatal screening for
 malformations (NEG QUAD)
 20 weeks gestation of pregnancy
Vital Signs

 BMI:
Fetal Evaluation

 Num Of Fetuses:         1
 Cardiac Activity:       Observed
 Presentation:           Breech, footling
 Placenta:               Posterior
 P. Cord Insertion:      Visualized, central

 Amniotic Fluid
 AFI FV:      Within normal limits

                             Largest Pocket(cm)

Biometry

 BPD:      47.4  mm     G. Age:  20w 2d         33  %    CI:        71.09   %    70 - 86
                                                         FL/HC:      17.9   %    15.9 -
 HC:      179.1  mm     G. Age:  20w 2d         25  %    HC/AC:      1.04        1.06 -
 AC:       172   mm     G. Age:  22w 1d         86  %    FL/BPD:     67.7   %
 FL:       32.1  mm     G. Age:  20w 0d         19  %    FL/AC:      18.7   %    20 - 24
 CER:      22.4  mm     G. Age:  21w 0d         72  %

 LV:        5.3  mm
 CM:        4.6  mm

 Est. FW:     397  gm    0 lb 14 oz      65  %
OB History

 Gravidity:    3         Term:   1         SAB:   1
 Living:       1
Gestational Age

 U/S Today:     20w 5d                                        EDD:   01/15/20
 Best:          20w 5d     Det. By:  Previous Ultrasound      EDD:   01/15/20
                                     (05/20/19)
Anatomy

 Cranium:               Appears normal         Aortic Arch:            Appears normal
 Cavum:                 Appears normal         Ductal Arch:            Appears normal
 Ventricles:            Appears normal         Diaphragm:              Appears normal
 Choroid Plexus:        Appears normal         Stomach:                Appears normal, left
                                                                       sided
 Cerebellum:            Appears normal         Abdomen:                Appears normal
 Posterior Fossa:       Appears normal         Abdominal Wall:         Appears nml (cord
                                                                       insert, abd wall)
 Nuchal Fold:           Not applicable (>20    Cord Vessels:           Appears normal (3
                        wks GA)                                        vessel cord)
 Face:                  Appears normal         Kidneys:                Appear normal
                        (orbits and profile)
 Lips:                  Appears normal         Bladder:                Appears normal
 Thoracic:              Appears normal         Spine:                  Appears normal
 Heart:                 Appears normal         Upper Extremities:      Appears normal
                        (4CH, axis, and
                        situs)
 RVOT:                  Appears normal         Lower Extremities:      Appears normal
 LVOT:                  Appears normal

 Other:  Fetus appears to be female. Heels and 5th digit visualized. Open
         hands visualized. Nasal bone visualized. 3VV and 3VTV visualized.
Cervix Uterus Adnexa

 Cervix
 Length:           4.34  cm.
 Normal appearance by transabdominal scan.

 Uterus
 No abnormality visualized.
 Right Ovary
 Size(cm)     3.86   x   2.2    x  2.4       Vol(ml):
 Within normal limits.

 Left Ovary
 Size(cm)     2.61   x   1.2    x  2         Vol(ml):
 Within normal limits.

 Cul De Sac
 No free fluid seen.

 Adnexa
 No abnormality visualized.
Comments

 This patient was seen for a detailed fetal anatomy scan due
 to a small for gestational age fetus that was noted on an
 ultrasound performed in your office.  Possible pyelectasis
 was also noted during that ultrasound exam.
 She denies any significant past medical history and denies
 any problems in her current pregnancy.
 She had a quad screen performed earlier in her pregnancy
 which indicated a Down syndrome risk of [DATE] and a
 trisomy 18 risk of [DATE].  An MSAFP was 1.13 MoM.
 Based on a review of the patient's records in [REDACTED], it appears
 that she had a first trimester ultrasound performed at
 between 5 to 6 weeks of her current pregnancy at the
 hospital which indicated a EDC January 15, 2020.  The
 patient reports that she was seen for that ultrasound due to
 vaginal spotting in the first trimester.
 Using an EDC January 15, 2020, the overall fetal growth
 measured today would be appropriate for her gestational age.
 There was normal amniotic fluid noted.
 There were no obvious fetal anomalies noted on today's
 ultrasound exam.  There were no signs of pyelectasis noted
 today.
 The patient was informed that anomalies may be missed due
 to technical limitations. If the fetus is in a suboptimal position
 or maternal habitus is increased, visualization of the fetus in
 the maternal uterus may be impaired.
 A follow-up exam was scheduled in 4 weeks to confirm her
 dates.  Should the fetal growth be consistent with an EDC January 15, 2020 at her next ultrasound, this date should be
 used as her final EDC.
